# Patient Record
Sex: Female | Born: 1970 | Race: White | Hispanic: No | Marital: Married | Smoking: Never smoker
Health system: Southern US, Community
[De-identification: ages and names within clinical notes are randomized; demographics above are authoritative.]

---

## 2008-06-09 HISTORY — PX: PARATHYROIDECTOMY: SHX19

## 2014-02-24 LAB — HM MAMMOGRAPHY

## 2014-02-24 LAB — HM PAP SMEAR: HM Pap smear: NEGATIVE

## 2015-01-28 ENCOUNTER — Encounter (HOSPITAL_COMMUNITY): Payer: Self-pay | Admitting: *Deleted

## 2015-01-28 ENCOUNTER — Emergency Department (HOSPITAL_COMMUNITY): Payer: Managed Care, Other (non HMO)

## 2015-01-28 DIAGNOSIS — Z23 Encounter for immunization: Secondary | ICD-10-CM | POA: Diagnosis not present

## 2015-01-28 DIAGNOSIS — S20221A Contusion of right back wall of thorax, initial encounter: Secondary | ICD-10-CM | POA: Diagnosis not present

## 2015-01-28 DIAGNOSIS — Y9389 Activity, other specified: Secondary | ICD-10-CM | POA: Diagnosis not present

## 2015-01-28 DIAGNOSIS — Y9289 Other specified places as the place of occurrence of the external cause: Secondary | ICD-10-CM | POA: Diagnosis not present

## 2015-01-28 DIAGNOSIS — Y998 Other external cause status: Secondary | ICD-10-CM | POA: Insufficient documentation

## 2015-01-28 DIAGNOSIS — S0990XA Unspecified injury of head, initial encounter: Secondary | ICD-10-CM | POA: Insufficient documentation

## 2015-01-28 DIAGNOSIS — S300XXA Contusion of lower back and pelvis, initial encounter: Secondary | ICD-10-CM | POA: Diagnosis not present

## 2015-01-28 DIAGNOSIS — S0101XA Laceration without foreign body of scalp, initial encounter: Secondary | ICD-10-CM | POA: Diagnosis not present

## 2015-01-28 DIAGNOSIS — W109XXA Fall (on) (from) unspecified stairs and steps, initial encounter: Secondary | ICD-10-CM | POA: Insufficient documentation

## 2015-01-28 NOTE — ED Notes (Signed)
Spoke with Dr Mora Bellman, received order for CT head.

## 2015-01-28 NOTE — ED Notes (Signed)
Pt states that she was walking down wood stairs in her socks and slipped and fell down 2 steps and hit the back of her head on the stair. Laceration noted to back of head. Denies LOC or any neurological deficits. Does not take blood thinners. Bleeding is controlled.

## 2015-01-28 NOTE — ED Notes (Signed)
Also c/o right sided rib/scapula pain.

## 2015-01-29 ENCOUNTER — Emergency Department (HOSPITAL_COMMUNITY): Payer: Managed Care, Other (non HMO)

## 2015-01-29 ENCOUNTER — Emergency Department (HOSPITAL_COMMUNITY)
Admission: EM | Admit: 2015-01-29 | Discharge: 2015-01-29 | Disposition: A | Discharge status: Home / Self Care | Payer: Managed Care, Other (non HMO) | Attending: Emergency Medicine | Admitting: Emergency Medicine

## 2015-01-29 DIAGNOSIS — IMO0002 Reserved for concepts with insufficient information to code with codable children: Secondary | ICD-10-CM

## 2015-01-29 DIAGNOSIS — W19XXXA Unspecified fall, initial encounter: Secondary | ICD-10-CM

## 2015-01-29 MED ORDER — TRAMADOL HCL 50 MG PO TABS: 50.0000 mg | ORAL_TABLET | Freq: Two times a day (BID) | ORAL | Status: DC | PRN

## 2015-01-29 MED ORDER — HYDROCODONE-ACETAMINOPHEN 5-325 MG PO TABS
1.0000 | ORAL_TABLET | Freq: Once | ORAL | Status: AC
  Administered 2015-01-29: 1 via ORAL
  Filled 2015-01-29: qty 1

## 2015-01-29 MED ORDER — TETANUS-DIPHTH-ACELL PERTUSSIS 5-2.5-18.5 LF-MCG/0.5 IM SUSP
0.5000 mL | Freq: Once | INTRAMUSCULAR | Status: AC
  Administered 2015-01-29: 0.5 mL via INTRAMUSCULAR
  Filled 2015-01-29: qty 0.5

## 2015-01-29 MED ORDER — IBUPROFEN 800 MG PO TABS
800.0000 mg | ORAL_TABLET | Freq: Once | ORAL | Status: AC
  Administered 2015-01-29: 800 mg via ORAL
  Filled 2015-01-29: qty 1

## 2015-01-29 NOTE — ED Provider Notes (Signed)
CSN: [id number]     Arrival date & time 01/28/15  2237 History  This chart was scribed for Tomasita Crumble, MD by Leone Payor, ED Scribe. This patient was seen in room A03C/A03C and the patient's care was started 12:44 AM.    Chief Complaint  Patient presents with  . Fall  . Head Laceration   The history is provided by the patient and a friend. No language interpreter was used.     HPI Comments: Shelby Sutton is a 44 y.o. female who presents to the Emergency Department complaining of fall that occurred 3 hours ago. Patient reports having a mechanical fall; states she slipped down stairs while wearing socks. She reports striking back of head and right shoulder/scapula upon falling. She denies LOC. She complains of a laceration to the posterior head along with constant, unchanged pain to right shoulder, rib, and lower back. She states movement aggravates her pain. She denies anti-coagulant use. She is unsure of last tetanus.   History reviewed. No pertinent past medical history. History reviewed. No pertinent past surgical history. No family history on file. Social History  Substance Use Topics  . Smoking status: Never Smoker   . Smokeless tobacco: None  . Alcohol Use: No   OB History    No data available     Review of Systems  10 Systems reviewed and all are negative for acute change except as noted in the HPI.    Allergies  Codeine  Home Medications   Prior to Admission medications   Not on File   BP 115/68 mmHg  Pulse 83  Temp(Src) 98.3 F (36.8 C) (Oral)  Resp 14  SpO2 100%  LMP 01/21/2015 Physical Exam  Constitutional: She is oriented to person, place, and time. She appears well-developed and well-nourished. No distress.  HENT:  Head: Normocephalic. Head is with laceration.  Nose: Nose normal.  Mouth/Throat: Oropharynx is clear and moist. No oropharyngeal exudate.  1 cm laceration to the right posterior scalp.   Eyes: Conjunctivae and EOM are normal. Pupils are  equal, round, and reactive to light. No scleral icterus.  Neck: Normal range of motion. Neck supple. No JVD present. No tracheal deviation present. No thyromegaly present.  Cardiovascular: Normal rate, regular rhythm and normal heart sounds.  Exam reveals no gallop and no friction rub.   No murmur heard. Pulmonary/Chest: Effort normal and breath sounds normal. No respiratory distress. She has no wheezes. She exhibits no tenderness.  Abdominal: Soft. Bowel sounds are normal. She exhibits no distension and no mass. There is no tenderness. There is no rebound and no guarding.  Musculoskeletal: Normal range of motion. She exhibits tenderness. She exhibits no edema.  Bruising and tenderness to right posterior ribs and lumbar spine.   Lymphadenopathy:    She has no cervical adenopathy.  Neurological: She is alert and oriented to person, place, and time. No cranial nerve deficit. She exhibits normal muscle tone.  Skin: Skin is warm and dry. Bruising noted. No rash noted. No erythema. No pallor.  Nursing note and vitals reviewed.   ED Course  Procedures   DIAGNOSTIC STUDIES: Oxygen Saturation is 100% on RA, normal by my interpretation.    COORDINATION OF CARE: 12:50 AM Discussed treatment plan with pt at bedside and pt agreed to plan.   Labs Review Labs Reviewed - No data to display  Imaging Review Dg Chest 2 View  01/28/2015   CLINICAL DATA:  Larey Seat down 2 steps, striking head. Pain to the right posterior  back into the shoulder blades.  EXAM: CHEST  2 VIEW  COMPARISON:  None.  FINDINGS: The heart size and mediastinal contours are within normal limits. Both lungs are clear. The visualized skeletal structures are unremarkable.  IMPRESSION: No active cardiopulmonary disease.   Electronically Signed   By: Burman Nieves M.D.   On: 01/28/2015 23:57   Ct Head Wo Contrast  01/29/2015   CLINICAL DATA:  Slip and fall injury down 2 steps, striking back of head. Laceration to the back of head. No loss  of consciousness. No neurologic deficit.  EXAM: CT HEAD WITHOUT CONTRAST  TECHNIQUE: Contiguous axial images were obtained from the base of the skull through the vertex without intravenous contrast.  COMPARISON:  None.  FINDINGS: Ventricles and sulci appear symmetrical. No ventricular dilatation. No mass effect or midline shift. No abnormal extra-axial fluid collections. Gray-white matter junctions are distinct. Basal cisterns are not effaced. No evidence of acute intracranial hemorrhage. No depressed skull fractures. Visualized paranasal sinuses and mastoid air cells are not opacified.  IMPRESSION: No acute intracranial abnormalities.   Electronically Signed   By: Burman Nieves M.D.   On: 01/29/2015 00:14   I have personally reviewed and evaluated these images and lab results as part of my medical decision-making.   EKG Interpretation None      MDM   Final diagnoses:  None   Patient presents to the ED after a fall with pain on her L spine, R post ribs, and top of her head.  Xrays are negative for fractures or significant injury.She was given motrin and norco for pain control.  Tetanus shot was updated.  She wil be DC with tramadol to take as needed.  Wound care follow up in 5-7 days.    LACERATION REPAIR Performed by: Tomasita Crumble Authorized byTomasita Crumble Consent: Verbal consent obtained. Risks and benefits: risks, benefits and alternatives were discussed Consent given by: patient Patient identity confirmed: provided demographic data Prepped and Draped in normal sterile fashion Wound explored  Laceration Location: Posterior scalp  Laceration Length: 2cm  No Foreign Bodies seen or palpated  Irrigation method: syringe Amount of cleaning: standard  Skin closure: staples  Number of staples: 3   Patient tolerance: Patient tolerated the procedure well with no immediate complications.   I personally performed the services described in this documentation, which was scribed in  my presence. The recorded information has been reviewed and is accurate.   Tomasita Crumble, MD 01/29/15 [phone]

## 2015-01-29 NOTE — Discharge Instructions (Signed)
Laceration Care, Adult Shelby Sutton, your xrays and CT scan did not show any injuries.  Take motrin as needed for pain control.  If the pain becomes severe, take tramadol.  Tramadol will make you drowsy when you take it.  See a primary care physician within 5-7 days for wound check.  If symptoms worsen, come back to the ED immediately.  Thank you. A laceration is a cut that goes through all layers of the skin. The cut goes into the tissue beneath the skin. HOME CARE For stitches (sutures) or staples:  Keep the cut clean and dry.  If you have a bandage (dressing), change it at least once a day. Change the bandage if it gets wet or dirty, or as told by your doctor.  Wash the cut with soap and water 2 times a day. Rinse the cut with water. Pat it dry with a clean towel.  Put a thin layer of medicated cream on the cut as told by your doctor.  You may shower after the first 24 hours. Do not soak the cut in water until the stitches are removed.  Only take medicines as told by your doctor.  Have your stitches or staples removed as told by your doctor. For skin adhesive strips:  Keep the cut clean and dry.  Do not get the strips wet. You may take a bath, but be careful to keep the cut dry.  If the cut gets wet, pat it dry with a clean towel.  The strips will fall off on their own. Do not remove the strips that are still stuck to the cut. For wound glue:  You may shower or take baths. Do not soak or scrub the cut. Do not swim. Avoid heavy sweating until the glue falls off on its own. After a shower or bath, pat the cut dry with a clean towel.  Do not put medicine on your cut until the glue falls off.  If you have a bandage, do not put tape over the glue.  Avoid lots of sunlight or tanning lamps until the glue falls off. Put sunscreen on the cut for the first year to reduce your scar.  The glue will fall off on its own. Do not pick at the glue. You may need a tetanus shot if:  You cannot  remember when you had your last tetanus shot.  You have never had a tetanus shot. If you need a tetanus shot and you choose not to have one, you may get tetanus. Sickness from tetanus can be serious. GET HELP RIGHT AWAY IF:   Your pain does not get better with medicine.  Your arm, hand, leg, or foot loses feeling (numbness) or changes color.  Your cut is bleeding.  Your joint feels weak, or you cannot use your joint.  You have painful lumps on your body.  Your cut is red, puffy (swollen), or painful.  You have a red line on the skin near the cut.  You have yellowish-white fluid (pus) coming from the cut.  You have a fever.  You have a bad smell coming from the cut or bandage.  Your cut breaks open before or after stitches are removed.  You notice something coming out of the cut, such as wood or glass.  You cannot move a finger or toe. MAKE SURE YOU:   Understand these instructions.  Will watch your condition.  Will get help right away if you are not doing well or get worse.  Document Released: 11/12/2007 Document Revised: 08/18/2011 Document Reviewed: 11/19/2010 [facility] Patient Information 2015 [place], [place]. This information is not intended to replace advice given to you by your health care provider. Make sure you discuss any questions you have with your health care provider.  Contusion A contusion is a deep bruise. Contusions happen when an injury causes bleeding under the skin. Signs of bruising include pain, puffiness (swelling), and discolored skin. The contusion may turn blue, purple, or yellow. HOME CARE   Put ice on the injured area.  Put ice in a plastic bag.  Place a towel between your skin and the bag.  Leave the ice on for 15-20 minutes, 03-04 times a day.  Only take medicine as told by your doctor.  Rest the injured area.  If possible, raise (elevate) the injured area to lessen puffiness. GET HELP RIGHT AWAY IF:   You have more bruising or  puffiness.  You have pain that is getting worse.  Your puffiness or pain is not helped by medicine. MAKE SURE YOU:   Understand these instructions.  Will watch your condition.  Will get help right away if you are not doing well or get worse. Document Released: 11/12/2007 Document Revised: 08/18/2011 Document Reviewed: 03/31/2011 [facility] Patient Information 2015 [place], [place]. This information is not intended to replace advice given to you by your health care provider. Make sure you discuss any questions you have with your health care provider. Stitches, Staples, or Skin Adhesive Strips  Stitches (sutures), staples, and skin adhesive strips hold the skin together as it heals. They will usually be in place for 7 days or less. HOME CARE  Wash your hands with soap and water before and after you touch your wound.  Only take medicine as told by your doctor.  Cover your wound only if your doctor told you to. Otherwise, leave it open to air.  Do not get your stitches wet or dirty. If they get dirty, dab them gently with a clean washcloth. Wet the washcloth with soapy water. Do not rub. Pat them dry gently.  Do not put medicine or medicated cream on your stitches unless your doctor told you to.  Do not take out your own stitches or staples. Skin adhesive strips will fall off by themselves.  Do not pick at the wound. Picking can cause an infection.  Do not miss your follow-up appointment.  If you have problems or questions, call your doctor. GET HELP RIGHT AWAY IF:   You have a temperature by mouth above 102 F (38.9 C), not controlled by medicine.  You have chills.  You have redness or pain around your stitches.  There is puffiness (swelling) around your stitches.  You notice fluid (drainage) from your stitches.  There is a bad smell coming from your wound. MAKE SURE YOU:  Understand these instructions.  Will watch your condition.  Will get help if you are not doing  well or get worse. Document Released: 03/23/2009 Document Revised: 08/18/2011 Document Reviewed: 03/23/2009 [facility] Patient Information 2015 [place], [place]. This information is not intended to replace advice given to you by your health care provider. Make sure you discuss any questions you have with your health care provider.

## 2016-03-10 ENCOUNTER — Other Ambulatory Visit: Payer: Self-pay | Admitting: Orthopedic Surgery

## 2016-03-10 DIAGNOSIS — R29898 Other symptoms and signs involving the musculoskeletal system: Secondary | ICD-10-CM

## 2016-03-21 ENCOUNTER — Ambulatory Visit
Admission: RE | Admit: 2016-03-21 | Discharge: 2016-03-21 | Disposition: A | Discharge status: Home / Self Care | Payer: Managed Care, Other (non HMO) | Source: Ambulatory Visit | Attending: Orthopedic Surgery | Admitting: Orthopedic Surgery

## 2016-03-21 DIAGNOSIS — R29898 Other symptoms and signs involving the musculoskeletal system: Secondary | ICD-10-CM

## 2016-03-21 MED ORDER — IOPAMIDOL (ISOVUE-M 200) INJECTION 41%
1.0000 mL | Freq: Once | INTRAMUSCULAR | Status: AC
  Administered 2016-03-21: 1 mL via EPIDURAL

## 2016-03-21 MED ORDER — METHYLPREDNISOLONE ACETATE 40 MG/ML INJ SUSP (RADIOLOG
120.0000 mg | Freq: Once | INTRAMUSCULAR | Status: AC
  Administered 2016-03-21: 120 mg via EPIDURAL

## 2016-03-21 NOTE — Discharge Instructions (Signed)

## 2016-07-24 IMAGING — CT CT HEAD W/O CM
1 series · 16 of 30 positions shown, 20 images · non-contrast
Comparison: None.

CLINICAL DATA: Slip and fall injury down 2 steps, striking back of
head. Laceration to the back of head. No loss of consciousness. No
neurologic deficit.

EXAM:
CT HEAD WITHOUT CONTRAST
TECHNIQUE: Contiguous axial images were obtained from the base of the skull
through the vertex without intravenous contrast.

[Series 2: head 5.0 h30s · axial · 0.47mm/px · z∈[-127,+18]mm · 16 of 33 slices shown, 20 images]
[im 2/33  brain]
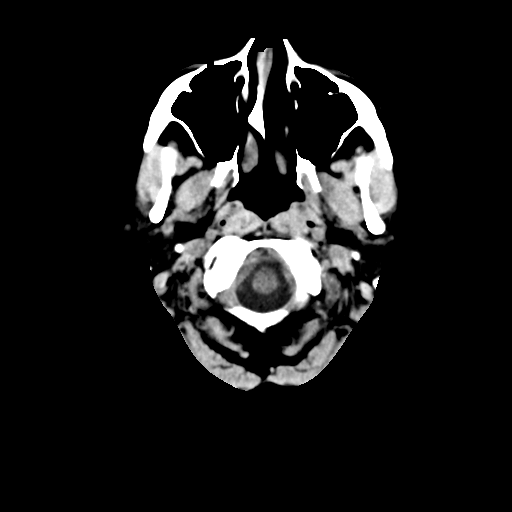
[im 2/33  bone]
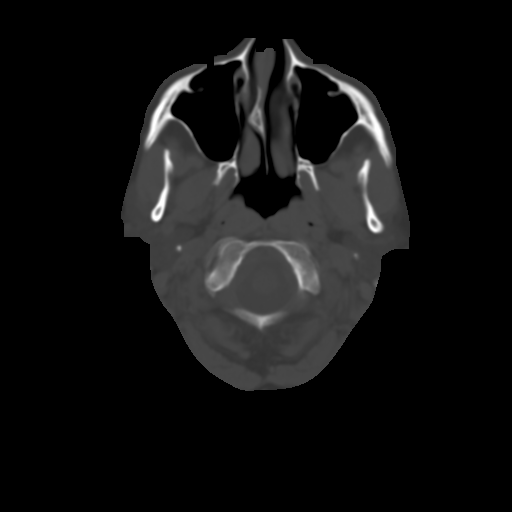
[im 4/33  brain]
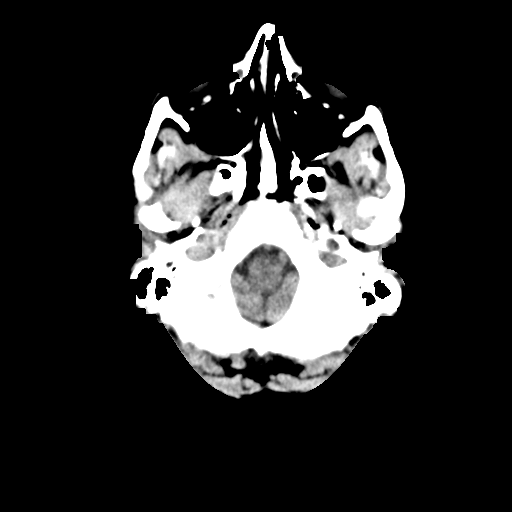
[im 6/33  brain]
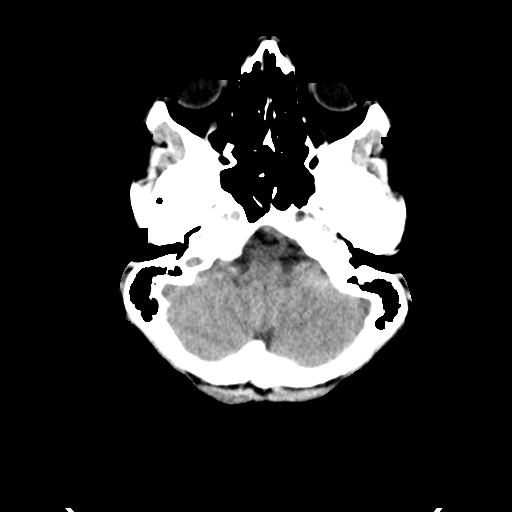
[im 8/33  brain]
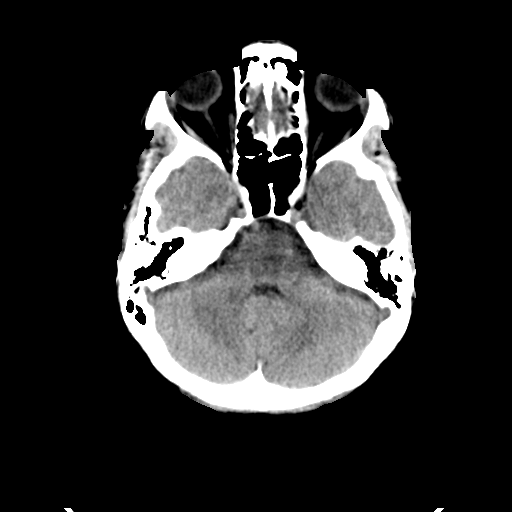
[im 9/33  brain]
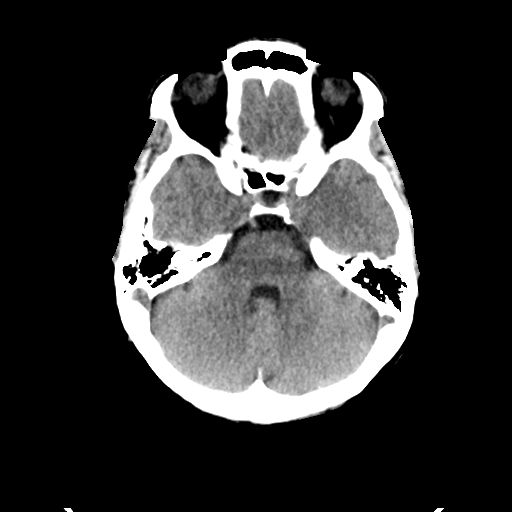
[im 9/33  bone]
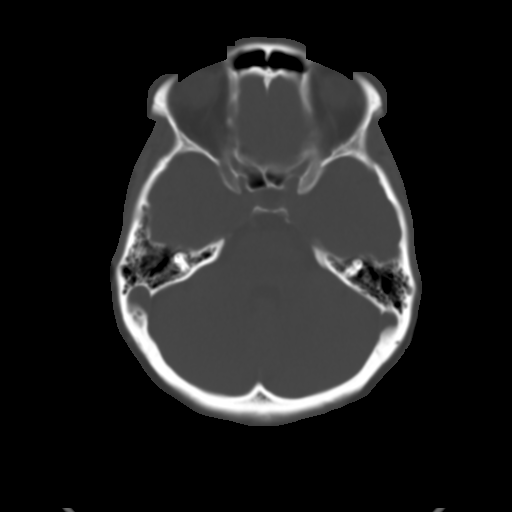
[im 12/33  brain]
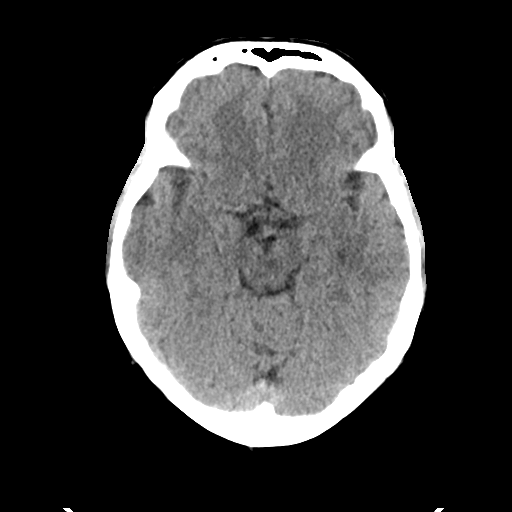
[im 14/33  brain]
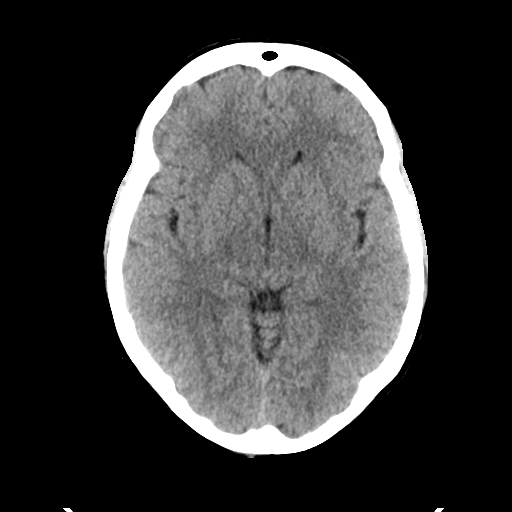
[im 16/33  brain]
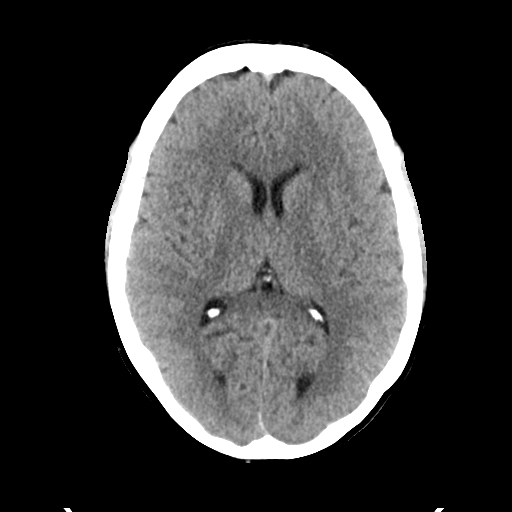
[im 17/33  brain]
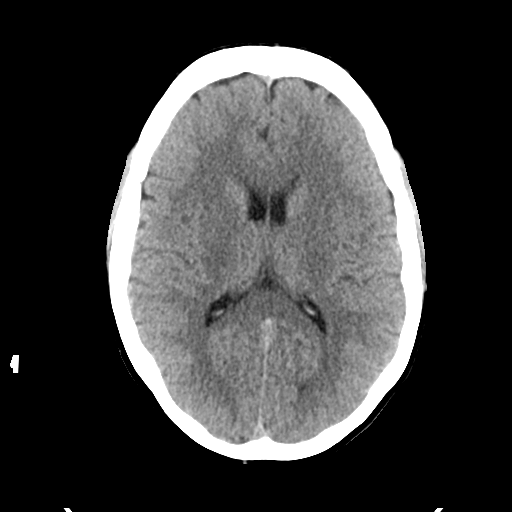
[im 17/33  bone]
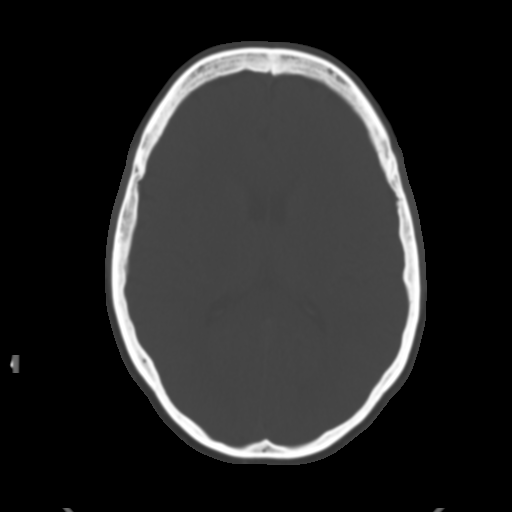
[im 19/33  brain]
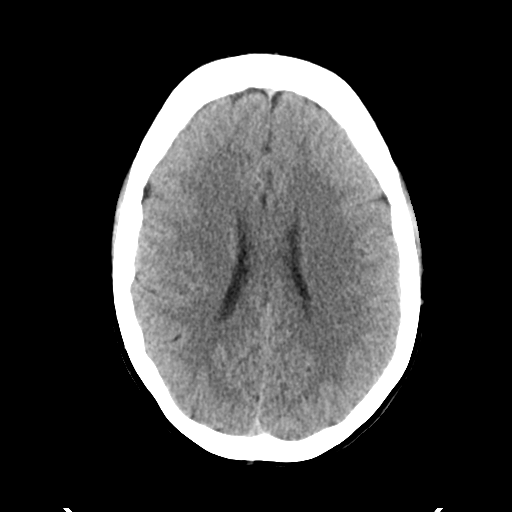
[im 21/33  brain]
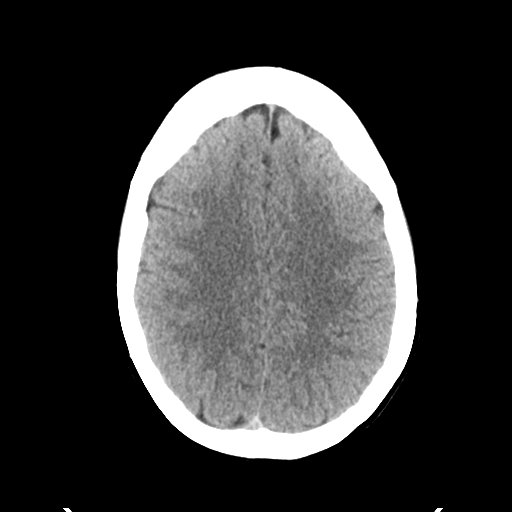
[im 24/33  brain]
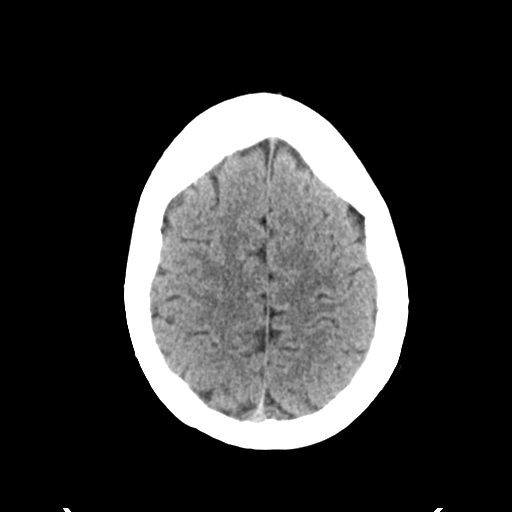
[im 25/33  brain]
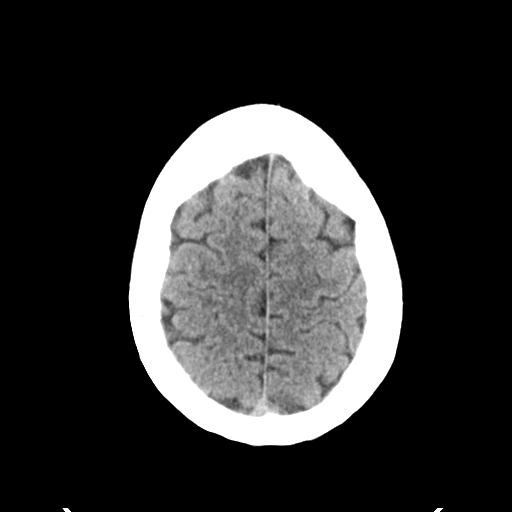
[im 25/33  bone]
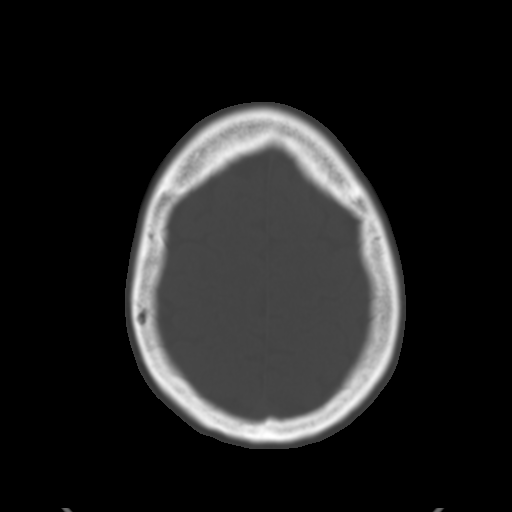
[im 27/33  brain]
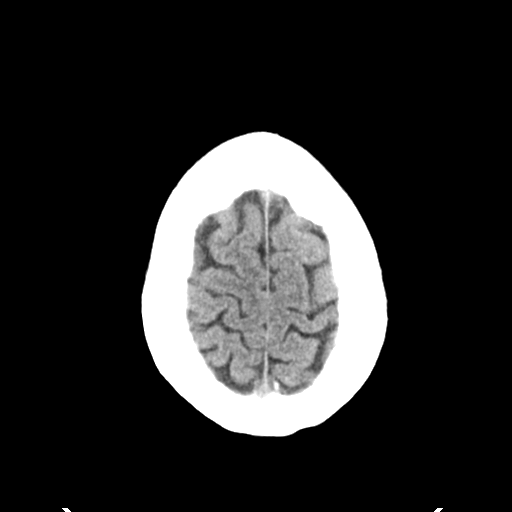
[im 29/33  brain]
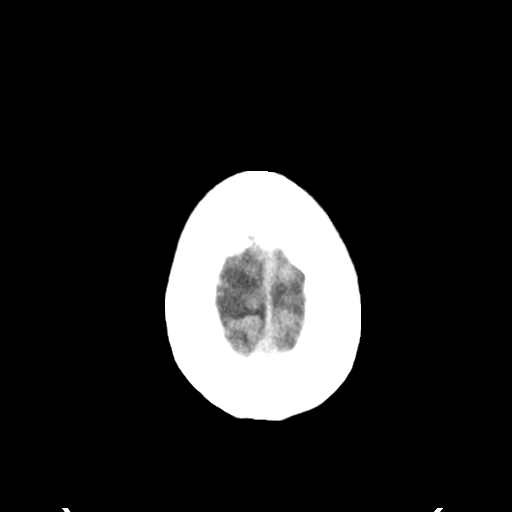
[im 31/33  brain]
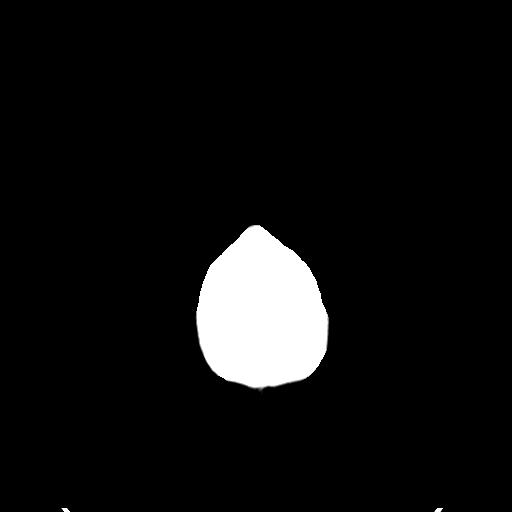

[16 of 30 positions shown; findings below may reference images not displayed]

FINDINGS: Ventricles and sulci appear symmetrical. No ventricular dilatation.
No mass effect or midline shift. No abnormal extra-axial fluid
collections. Gray-white matter junctions are distinct. Basal
cisterns are not effaced. No evidence of acute intracranial
hemorrhage. No depressed skull fractures. Visualized paranasal
sinuses and mastoid air cells are not opacified.
IMPRESSION: No acute intracranial abnormalities.

## 2016-11-10 ENCOUNTER — Ambulatory Visit (INDEPENDENT_AMBULATORY_CARE_PROVIDER_SITE_OTHER): Payer: Managed Care, Other (non HMO) | Admitting: Family Medicine

## 2016-11-10 ENCOUNTER — Telehealth: Payer: Self-pay | Admitting: Family Medicine

## 2016-11-10 ENCOUNTER — Encounter: Payer: Self-pay | Admitting: Family Medicine

## 2016-11-10 VITALS — BP 122/80 | HR 85 | Temp 98.1°F | Ht 66.5 in | Wt 156.4 lb

## 2016-11-10 DIAGNOSIS — Z77018 Contact with and (suspected) exposure to other hazardous metals: Secondary | ICD-10-CM | POA: Diagnosis not present

## 2016-11-10 DIAGNOSIS — R41840 Attention and concentration deficit: Secondary | ICD-10-CM

## 2016-11-10 DIAGNOSIS — Z9889 Other specified postprocedural states: Secondary | ICD-10-CM

## 2016-11-10 DIAGNOSIS — R5383 Other fatigue: Secondary | ICD-10-CM | POA: Diagnosis not present

## 2016-11-10 DIAGNOSIS — E892 Postprocedural hypoparathyroidism: Secondary | ICD-10-CM

## 2016-11-10 LAB — COMPREHENSIVE METABOLIC PANEL
ALK PHOS: 86 U/L (ref 39–117)
ALT: 17 U/L (ref 0–35)
AST: 20 U/L (ref 0–37)
Albumin: 4.6 g/dL (ref 3.5–5.2)
BUN: 15 mg/dL (ref 6–23)
CALCIUM: 9.5 mg/dL (ref 8.4–10.5)
CO2: 29 meq/L (ref 19–32)
CREATININE: 0.58 mg/dL (ref 0.40–1.20)
Chloride: 103 mEq/L (ref 96–112)
GFR: 118.19 mL/min (ref >60.00)
GLUCOSE: 95 mg/dL (ref 70–99)
Potassium: 4.2 mEq/L (ref 3.5–5.1)
Sodium: 137 mEq/L (ref 135–145)
TOTAL PROTEIN: 7.4 g/dL (ref 6.0–8.3)
Total Bilirubin: 0.3 mg/dL (ref 0.2–1.2)

## 2016-11-10 LAB — CBC
HCT: 39.1 % (ref 36.0–46.0)
HEMOGLOBIN: 13.2 g/dL (ref 12.0–15.0)
MCHC: 33.7 g/dL (ref 30.0–36.0)
MCV: 88.5 fl (ref 78.0–100.0)
PLATELETS: 284 10*3/uL (ref 150.0–400.0)
RBC: 4.42 Mil/uL (ref 3.87–5.11)
RDW: 13.2 % (ref 11.5–15.5)
WBC: 5.6 10*3/uL (ref 4.0–10.5)

## 2016-11-10 LAB — VITAMIN B12: Vitamin B-12: 363 pg/mL (ref 211–911)

## 2016-11-10 LAB — VITAMIN D 25 HYDROXY (VIT D DEFICIENCY, FRACTURES): VITD: 21.41 ng/mL — AB (ref 30.00–100.00)

## 2016-11-10 NOTE — Telephone Encounter (Signed)
[facility]

## 2016-11-10 NOTE — Patient Instructions (Signed)
It was a pleasure to meet you today! I look forward to partnering with you for your health care needs  Please schedule your complete physical exam at your convenience  Increase exercise, try to find some time for activities you enjoy  If you would like referral for ADD testing, please let me know

## 2016-11-10 NOTE — Progress Notes (Signed)
Subjective:    Patient ID: Shelby Sutton, female    DOB: [birth year], 46 y.o.   MRN: [id number]  HPI This is a 46 yo female who presents today to establish care. She is married with two children. She is not currently working. Moved to [place] from [place] 2 years ago. Has a lot of stress, her 61 yo mother lives with her and her family. Mother's health is poor. Her father is deceased and she took care of him while he died. Her main complaint today is low energy. Has always been low energy her entire life. Thinks she may have ADD, has difficulty with focus and concentration. Has been on Lexapro and Wellbutrin in the past. Wellbutrin gave her side effects- blunted emotions. She sleeps well but does not feel rested. Has gained 20 pounds in last 2 years, admits to comfort eating. Attends a [employer] Study, but has not made many friends since moving.   Is concerned for Mercury poisoning, has old dental fillings. Is looking into having them replaced with non-mercury fillings.She feels her symptoms could be related to information she has found on line about mercury poisoning.    Periods a little irregular, still having monthly, sometimes heavier, sometimes lighter. No vasomotor symptoms. Some increased irritation and moodiness.   No past medical history on file. Past Surgical History:  Procedure Laterality Date  . PARATHYROIDECTOMY  2010   Family History  Problem Relation Age of Onset  . Adopted: Yes   Social History  Substance Use Topics  . Smoking status: Never Smoker  . Smokeless tobacco: Never Used  . Alcohol use No       Review of Systems  Constitutional: Positive for fatigue.  HENT: Positive for tinnitus (longstanding).   Neurological: Negative for headaches.  Psychiatric/Behavioral: Positive for decreased concentration and dysphoric mood.       Objective:   Physical Exam Physical Exam  Constitutional: Oriented to person, place, and time. He appears well-developed and  well-nourished.  HENT:  Head: Normocephalic and atraumatic.  Eyes: Conjunctivae are normal.  Neck: Normal range of motion. Neck supple.  Cardiovascular: Normal rate, regular rhythm and normal heart sounds.   Pulmonary/Chest: Effort normal and breath sounds normal.  Musculoskeletal: Normal range of motion.  Neurological: Alert and oriented to person, place, and time.  Skin: Skin is warm and dry.  Psychiatric: Intermittently tearful throughout encounter. Judgment and thought content normal.  Vitals reviewed.  BP 122/80 (BP Location: Left Arm, Patient Position: Sitting, Cuff Size: Normal)   Pulse 85   Temp 98.1 F (36.7 C) (Oral)   Ht 5' 6.5" (1.689 m)   Wt 156 lb 6.4 oz (70.9 kg)   LMP 10/26/2016   SpO2 99%   BMI 24.87 kg/m  Depression screen [facility] 2/9 11/10/2016 11/10/2016  Decreased Interest 0 0  Down, Depressed, Hopeless 0 -  PHQ - 2 Score 0 0      Assessment & Plan:  1. Fatigue, unspecified type - longstanding, encouraged regular exercise, increased socialization, good food choices - CBC - Comprehensive metabolic panel - Thyroid Panel With TSH - Vitamin B12 - VITAMIN D 25 Hydroxy (Vit-D Deficiency, Fractures)  2. Exposure to mercury - Heavy Metals Panel, Blood  3. Poor concentration - offered referral for formalized testing, discussed possible treatments, correlation with mood - she declines testing at this time  - follow up CPE with pap in 2-4 months   Olean Ree, FNP-BC  [facility] [place], [facility]  11/11/2016 6:20 AM

## 2016-11-11 DIAGNOSIS — E892 Postprocedural hypoparathyroidism: Secondary | ICD-10-CM | POA: Insufficient documentation

## 2016-11-11 DIAGNOSIS — Z9889 Other specified postprocedural states: Secondary | ICD-10-CM | POA: Insufficient documentation

## 2016-11-11 LAB — THYROID PANEL WITH TSH
FREE THYROXINE INDEX: 2.1 (ref 1.4–3.8)
T3 UPTAKE: 27 % (ref 22–35)
T4, Total: 7.6 ug/dL (ref 4.5–12.0)
TSH: 0.52 m[IU]/L

## 2016-11-14 LAB — HEAVY METALS PANEL, BLOOD
Arsenic: 3 mcg/L (ref <23)
Mercury, B: <4 mcg/L (ref <=10)

## 2016-11-17 NOTE — Telephone Encounter (Signed)
Rec'd from [facility] forwarded 65 pages to Olean ReeGessner Deborah FNP

## 2016-12-22 ENCOUNTER — Encounter: Payer: Self-pay | Admitting: Family Medicine

## 2017-05-06 LAB — HM PAP SMEAR: HM Pap smear: NEGATIVE

## 2017-05-13 ENCOUNTER — Ambulatory Visit: Payer: Managed Care, Other (non HMO) | Admitting: Family Medicine

## 2017-05-15 ENCOUNTER — Encounter: Payer: Self-pay | Admitting: Family Medicine

## 2017-05-15 ENCOUNTER — Ambulatory Visit: Payer: Managed Care, Other (non HMO) | Admitting: Family Medicine

## 2017-05-15 VITALS — BP 120/80 | HR 85 | Temp 98.1°F | Wt 156.8 lb

## 2017-05-15 DIAGNOSIS — R5383 Other fatigue: Secondary | ICD-10-CM

## 2017-05-15 DIAGNOSIS — Z9889 Other specified postprocedural states: Secondary | ICD-10-CM

## 2017-05-15 DIAGNOSIS — E559 Vitamin D deficiency, unspecified: Secondary | ICD-10-CM

## 2017-05-15 DIAGNOSIS — M5431 Sciatica, right side: Secondary | ICD-10-CM

## 2017-05-15 DIAGNOSIS — E892 Postprocedural hypoparathyroidism: Secondary | ICD-10-CM

## 2017-05-15 MED ORDER — MELOXICAM 15 MG PO TABS: 15.0000 mg | ORAL_TABLET | Freq: Every day | ORAL | 1 refills | Status: DC

## 2017-05-15 MED ORDER — GABAPENTIN 100 MG PO CAPS: ORAL_CAPSULE | ORAL | 3 refills | Status: DC

## 2017-05-15 NOTE — Progress Notes (Addendum)
Shelby Sutton is a 46 y.o. female is here for follow up.  History of Present Illness:   HPI: See Assessment and Plan section for Problem Based Charting of issues discussed today.  Health Maintenance Due  Topic Date Due  . HIV Screening  05/14/1984  . PAP SMEAR  02/24/2017   Depression screen PHQ 2/9 11/10/2016 11/10/2016  Decreased Interest 0 0  Down, Depressed, Hopeless 0 -  PHQ - 2 Score 0 0   PMHx, SurgHx, SocialHx, FamHx, Medications, and Allergies were reviewed in the Visit Navigator and updated as appropriate.   Patient Active Problem List   Diagnosis Date Noted  . Right sided sciatica 05/20/2017  . Vitamin D deficiency 05/20/2017  . Fatigue 05/20/2017  . Status post parathyroidectomy (HCC) 11/11/2016   Social History   Tobacco Use  . Smoking status: Never Smoker  . Smokeless tobacco: Never Used  Substance Use Topics  . Alcohol use: No  . Drug use: No   Current Medications and Allergies:   .  gabapentin (NEURONTIN) 100 MG capsule, Please take 1-2 tablets at night., Disp: 60 capsule, Rfl: 3 .  meloxicam (MOBIC) 15 MG tablet, Take 1 tablet (15 mg total) by mouth daily., Disp: 30 tablet, Rfl: 1  Allergies  Allergen Reactions  . Codeine Other (See Comments)    agitated   Review of Systems   Pertinent items are noted in the HPI. Otherwise, ROS is negative.  Vitals:   Vitals:   05/15/17 0944  BP: 120/80  Pulse: 85  Temp: 98.1 F (36.7 C)  TempSrc: Oral  SpO2: 98%  Weight: 156 lb 12.8 oz (71.1 kg)     Body mass index is 24.93 kg/m.   Physical Exam:   Physical Exam  Constitutional: She appears well-nourished.  HENT:  Head: Normocephalic and atraumatic.  Eyes: EOM are normal. Pupils are equal, round, and reactive to light.  Neck: Normal range of motion. Neck supple.  Cardiovascular: Normal rate, regular rhythm, normal heart sounds and intact distal pulses.  Pulmonary/Chest: Effort normal.  Abdominal: Soft.  Skin: Skin is warm.  Psychiatric: She  has a normal mood and affect. Her behavior is normal.  Nursing note and vitals reviewed.   Assessment and Plan:   Shelby FiscalLori was seen today for pain and fatigue.  Diagnoses and all orders for this visit:  Right sided sciatica Comments: Hx of chronic right sciatica, s/p EDI and oral medications. She saw a chiropractor for a year with some relief. [facility]. She states that the pain is a constant ache that keeps her from being able to sleep through the night. Will trial pain regimen below. Regular exercise and healthy eating recommended.  Orders: -     gabapentin (NEURONTIN) 100 MG capsule; Please take 1-2 tablets at night. -     meloxicam (MOBIC) 15 MG tablet; Take 1 tablet (15 mg total) by mouth daily.  Status post parathyroidectomy [facility](HCC) Comments: Will request records to view recent labs.   Fatigue, unspecified type Comments: As above. + snoring. Will monitor and consider sleep study in the future.   Vitamin D deficiency Comments: Continue supplementation.    Records requested if needed. Time spent with the patient: 30 minutes, of which >50% was spent in obtaining information about her symptoms, reviewing her previous labs, evaluations, and treatments, counseling her about her condition (please see the discussed topics above), and developing a plan to further investigate it; she had a number of questions which I addressed.   .Marland Kitchen  Reviewed expectations re: course of current medical issues. . Discussed self-management of symptoms. . Outlined signs and symptoms indicating need for more acute intervention. . Patient verbalized understanding and all questions were answered. Marland Kitchen. Health Maintenance issues including appropriate healthy diet, exercise, and smoking avoidance were discussed with patient. . See orders for this visit as documented in the electronic medical record. . Patient received an After Visit Summary.  Helane RimaErica Arliss Hepburn, DO [place], [place] 05/20/2017  No future appointments.

## 2017-05-19 ENCOUNTER — Encounter: Payer: Self-pay | Admitting: Family Medicine

## 2017-05-20 DIAGNOSIS — E559 Vitamin D deficiency, unspecified: Secondary | ICD-10-CM | POA: Insufficient documentation

## 2017-05-20 DIAGNOSIS — R5383 Other fatigue: Secondary | ICD-10-CM | POA: Insufficient documentation

## 2017-05-20 DIAGNOSIS — M5431 Sciatica, right side: Secondary | ICD-10-CM | POA: Insufficient documentation

## 2017-05-23 LAB — TSH: TSH: 0.66 (ref 0.41–5.90)

## 2017-05-23 LAB — LIPID PANEL
Cholesterol: 203 — AB (ref 0–200)
HDL: 69 (ref 35–70)
LDL CALC: 117
Triglycerides: 80 (ref 40–160)

## 2017-05-23 LAB — HEMOGLOBIN A1C: HEMOGLOBIN A1C: 5.4

## 2017-05-23 LAB — VITAMIN D 25 HYDROXY (VIT D DEFICIENCY, FRACTURES): VIT D 25 HYDROXY: 20

## 2017-06-05 LAB — HM MAMMOGRAPHY: HM Mammogram: ABNORMAL — AB (ref 0–4)

## 2017-06-10 ENCOUNTER — Other Ambulatory Visit: Payer: Self-pay | Admitting: Obstetrics and Gynecology

## 2017-06-10 DIAGNOSIS — R928 Other abnormal and inconclusive findings on diagnostic imaging of breast: Secondary | ICD-10-CM

## 2017-06-12 ENCOUNTER — Ambulatory Visit
Admission: RE | Admit: 2017-06-12 | Discharge: 2017-06-12 | Disposition: A | Discharge status: Home / Self Care | Payer: Managed Care, Other (non HMO) | Source: Ambulatory Visit | Attending: Obstetrics and Gynecology | Admitting: Obstetrics and Gynecology

## 2017-06-12 DIAGNOSIS — R928 Other abnormal and inconclusive findings on diagnostic imaging of breast: Secondary | ICD-10-CM

## 2017-06-24 ENCOUNTER — Encounter: Payer: Self-pay | Admitting: Physical Therapy

## 2018-02-22 ENCOUNTER — Ambulatory Visit: Payer: Managed Care, Other (non HMO) | Admitting: Family Medicine

## 2018-02-22 ENCOUNTER — Encounter: Payer: Self-pay | Admitting: Family Medicine

## 2018-02-22 VITALS — BP 116/84 | HR 77 | Temp 98.5°F | Ht 66.5 in | Wt 160.2 lb

## 2018-02-22 DIAGNOSIS — E559 Vitamin D deficiency, unspecified: Secondary | ICD-10-CM

## 2018-02-22 DIAGNOSIS — E892 Postprocedural hypoparathyroidism: Secondary | ICD-10-CM

## 2018-02-22 DIAGNOSIS — M2550 Pain in unspecified joint: Secondary | ICD-10-CM | POA: Diagnosis not present

## 2018-02-22 DIAGNOSIS — Z9889 Other specified postprocedural states: Secondary | ICD-10-CM

## 2018-02-22 LAB — COMPREHENSIVE METABOLIC PANEL
ALBUMIN: 4.4 g/dL (ref 3.5–5.2)
ALK PHOS: 85 U/L (ref 39–117)
ALT: 25 U/L (ref 0–35)
AST: 23 U/L (ref 0–37)
BILIRUBIN TOTAL: 0.3 mg/dL (ref 0.2–1.2)
BUN: 18 mg/dL (ref 6–23)
CO2: 29 mEq/L (ref 19–32)
Calcium: 9.3 mg/dL (ref 8.4–10.5)
Chloride: 103 mEq/L (ref 96–112)
Creatinine, Ser: 0.55 mg/dL (ref 0.40–1.20)
GFR: 124.98 mL/min (ref >60.00)
Glucose, Bld: 88 mg/dL (ref 70–99)
POTASSIUM: 3.9 meq/L (ref 3.5–5.1)
SODIUM: 137 meq/L (ref 135–145)
TOTAL PROTEIN: 7.2 g/dL (ref 6.0–8.3)

## 2018-02-22 LAB — CBC WITH DIFFERENTIAL/PLATELET
Basophils Absolute: 0 10*3/uL (ref 0.0–0.1)
Basophils Relative: 0.7 % (ref 0.0–3.0)
EOS ABS: 0.1 10*3/uL (ref 0.0–0.7)
Eosinophils Relative: 1.3 % (ref 0.0–5.0)
HCT: 37.6 % (ref 36.0–46.0)
Hemoglobin: 12.6 g/dL (ref 12.0–15.0)
LYMPHS ABS: 1.3 10*3/uL (ref 0.7–4.0)
Lymphocytes Relative: 17.4 % (ref 12.0–46.0)
MCHC: 33.6 g/dL (ref 30.0–36.0)
MCV: 87.9 fl (ref 78.0–100.0)
MONO ABS: 0.5 10*3/uL (ref 0.1–1.0)
MONOS PCT: 6.3 % (ref 3.0–12.0)
NEUTROS ABS: 5.5 10*3/uL (ref 1.4–7.7)
NEUTROS PCT: 74.3 % (ref 43.0–77.0)
PLATELETS: 280 10*3/uL (ref 150.0–400.0)
RBC: 4.28 Mil/uL (ref 3.87–5.11)
RDW: 12.7 % (ref 11.5–15.5)
WBC: 7.4 10*3/uL (ref 4.0–10.5)

## 2018-02-22 LAB — SEDIMENTATION RATE: SED RATE: 14 mm/h (ref 0–20)

## 2018-02-22 LAB — VITAMIN D 25 HYDROXY (VIT D DEFICIENCY, FRACTURES): VITD: 32.31 ng/mL (ref 30.00–100.00)

## 2018-02-22 LAB — TSH: TSH: 0.47 u[IU]/mL (ref 0.35–4.50)

## 2018-02-22 LAB — C-REACTIVE PROTEIN: CRP: 0.2 mg/dL — ABNORMAL LOW (ref 0.5–20.0)

## 2018-02-22 NOTE — Progress Notes (Signed)
Patient: Shelby Sutton MRN: [id number] DOB: 04/22/1971 PCP: Helane RimaWallace, Erica, DO     Subjective:  Chief Complaint  Patient presents with  . Joint Pain    knees,hips, shoulder    HPI: The patient is a 47 y.o. female who presents today for joint pain in her knees/hips/shoulders/elbows for about 6 months-1 year. She describes it as stiff and aching. Worse after sitting for long periods of time. It is keeping her up at night due to the pain. Pain is constant. She has a right sided disc herniation that has caused right sided leg numbness and some pain, but she is having pain in so many other joints that she doesn't feel like this is the cause. Pain rated as 3-4/10 and described as achy. Aleve and advil do help the pain. Does not completely alleviate the pain, but does help. She takes it at night. Pain is just there. She denies any joint swelling/redness.  No problems with her hands. NO hx of RA/lupus in family. No trauma to any of her joints and no decreased ROm.   She also had a stress fracture recently and has hx of parathyroidecetomy with no follow up DEXAs done. Denies any other pathological fractures. No hx of steroid use and does not smoke.   Hx of vitamin D deficiency and I wanting to know if we can check this today. Not good at taking her replacements.   Review of Systems  Constitutional: Negative for chills, fatigue, fever and unexpected weight change.  HENT: Negative for mouth sores and nosebleeds.   Eyes:       Denies dry eyes/mouth   Respiratory: Negative for cough, shortness of breath and wheezing.   Cardiovascular: Negative for chest pain and palpitations.  Gastrointestinal: Negative for abdominal pain, diarrhea, nausea and vomiting.  Endocrine: Negative for cold intolerance and heat intolerance.  Musculoskeletal: Positive for arthralgias, back pain and myalgias. Negative for joint swelling.       Pt with c/o multiple areas of joint pain-knees, elbows, hips and shoulders  Skin:  Negative for rash.  Neurological: Negative for dizziness and headaches.  Psychiatric/Behavioral: Positive for sleep disturbance.    Allergies Patient is allergic to codeine.  Past Medical History Patient  has no past medical history on file.  Surgical History Patient  has a past surgical history that includes Parathyroidectomy (2010).  Family History Pateint's family history is not on file. She was adopted.  Social History Patient  reports that she has never smoked. She has never used smokeless tobacco. She reports that she does not drink alcohol or use drugs.    Objective: Vitals:   02/22/18 1327  BP: 116/84  Pulse: 77  Temp: 98.5 F (36.9 C)  TempSrc: Oral  SpO2: 97%  Weight: 160 lb 3.2 oz (72.7 kg)  Height: 5' 6.5" (1.689 m)    Body mass index is 25.47 kg/m.  Physical Exam  Constitutional: She is oriented to person, place, and time. She appears well-developed and well-nourished.  Eyes: Pupils are equal, round, and reactive to light. Conjunctivae and EOM are normal.  Neck: Normal range of motion. Neck supple. No thyromegaly present.  Cardiovascular: Normal rate, regular rhythm, normal heart sounds and intact distal pulses.  Pulmonary/Chest: Effort normal and breath sounds normal.  Abdominal: Soft. Bowel sounds are normal.  Musculoskeletal: Normal range of motion. She exhibits tenderness (TTP over her elbows, knees, hips and shoulders). She exhibits no edema.  Full rom in all of her joints. No edema/erythema or deformation.  Lymphadenopathy:    She has no cervical adenopathy.  Neurological: She is alert and oriented to person, place, and time. She displays normal reflexes. No cranial nerve deficit.  Vitals reviewed.      Assessment/plan: 1. Arthralgia, unspecified joint Discussed with her will check labs to rule out RA/auto immune issue, but history and exam not really supportive of this. Discussed she sounds more like fibromyalgia. Will f/u with lab work.  Advised tumeric for inflammation/arthritis. If all labs negative will need to f/u with PCP for possible treatment for fibromyalgia vs. OA.  - CBC with Differential/Platelet - Comprehensive metabolic panel - TSH - ANA - Rheumatoid factor - C-reactive protein - Sedimentation rate  2. Vitamin D deficiency  - VITAMIN D 25 Hydroxy (Vit-D Deficiency, Fractures)  3. Status post parathyroidectomy (HCC) Never had DEXA following her long hx of untreated elevated calcium/hyperparathyroid. Needs this done. Will order DEXA today. checking vitamin D. Discussed will send results to pcp.  - DG Bone Density; Future      Return if symptoms worsen or fail to improve.    Orland Mustard, MD [facility]   02/22/2018

## 2018-02-24 LAB — ANA: Anti Nuclear Antibody(ANA): NEGATIVE

## 2018-02-24 LAB — RHEUMATOID FACTOR: Rhuematoid fact SerPl-aCnc: <14 IU/mL (ref <14)

## 2018-02-25 ENCOUNTER — Telehealth: Payer: Self-pay

## 2018-02-25 NOTE — Telephone Encounter (Signed)
Copied from CRM [id number]. Topic: Referral - Question >> Feb 25, 2018  3:50 PM Baldo DaubAlexander, Amber L wrote: Reason for CRM:  Pt states that Dr. Artis FlockWolfe was supposed to order a bone density scan for her, but she hasn't heard anything.  Pt is calling to check on the status of this. Pt can be reached at [phone]

## 2018-02-25 NOTE — Telephone Encounter (Signed)
Called and spoke to patient.  Advised can take up to 2 wks to receive call to schedule appt for bone density scan.  Pt verbalized understanding.

## 2018-03-02 ENCOUNTER — Ambulatory Visit (INDEPENDENT_AMBULATORY_CARE_PROVIDER_SITE_OTHER)
Admission: RE | Admit: 2018-03-02 | Discharge: 2018-03-02 | Disposition: A | Discharge status: Home / Self Care | Payer: Managed Care, Other (non HMO) | Source: Ambulatory Visit | Attending: Family Medicine | Admitting: Family Medicine

## 2018-03-02 DIAGNOSIS — E559 Vitamin D deficiency, unspecified: Secondary | ICD-10-CM | POA: Diagnosis not present

## 2018-03-02 DIAGNOSIS — Z9889 Other specified postprocedural states: Secondary | ICD-10-CM

## 2018-03-02 DIAGNOSIS — E892 Postprocedural hypoparathyroidism: Secondary | ICD-10-CM | POA: Diagnosis not present

## 2018-03-04 ENCOUNTER — Ambulatory Visit: Payer: Managed Care, Other (non HMO) | Admitting: Family Medicine

## 2018-03-04 ENCOUNTER — Telehealth: Payer: Self-pay | Admitting: Family Medicine

## 2018-03-04 ENCOUNTER — Encounter: Payer: Self-pay | Admitting: Family Medicine

## 2018-03-04 VITALS — BP 110/70 | HR 73 | Temp 98.3°F | Ht 66.5 in | Wt 161.6 lb

## 2018-03-04 DIAGNOSIS — T148XXA Other injury of unspecified body region, initial encounter: Secondary | ICD-10-CM | POA: Diagnosis not present

## 2018-03-04 MED ORDER — CYCLOBENZAPRINE HCL 10 MG PO TABS: 10.0000 mg | ORAL_TABLET | Freq: Three times a day (TID) | ORAL | 0 refills | Status: AC | PRN

## 2018-03-04 MED ORDER — METHYLPREDNISOLONE ACETATE 80 MG/ML IJ SUSP
80.0000 mg | Freq: Once | INTRAMUSCULAR | Status: AC
  Administered 2018-03-04: 80 mg via INTRAMUSCULAR

## 2018-03-04 NOTE — Telephone Encounter (Signed)
Copied from CRM [id number]. Topic: General - Other >> Mar 04, 2018  8:32 AM Gaynelle Adu wrote: Reason for CRM:  Patient is calling to state she is having lower back spasms this is the second day, she stated she has a history of back issues. She is  wanting to either schdeule a appt for this afternoon after 12, or if she can have something be called into the pharmacy for her back.   Preferred Pharmacy:[facility] [phone] [place], [place] - [address] [phone] (Phone) [phone] (Fax)

## 2018-03-04 NOTE — Progress Notes (Signed)
Patient: Shelby Sutton MRN: [id number] DOB: [birth year] PCP: Helane Rima, DO     Subjective:  Chief Complaint  Patient presents with  . Back Pain    x 4-5 days    HPI: The patient is a 47 y.o. female who presents today for lower back pain.  Pain is worse on left side.  With movement pain is an 8/10. She has history of sciatica on right side and numbness. This is at her baseline. She states about 5 days ago she was cleaning and her left lower back started to hurt. She did not lift anything heavy. She tried to roll it out. On Monday it was achy and by Tuesday afternoon it had spasmed up. She can hardly walk. She has been icing and laying in chair/bed. Nothing is really helping it. She did sleep on a heating pad and this did help some. She felt better this AM, but after she started to walk around it started to tighten up again. Pain is on the left hip/glut and lower back. Pain rated as a 8/10. No radiation. Pain is cramping/spasm and only when moving. She has taken ibuprofen with little relief. No numbness, weakness or radicular symptoms.   Review of Systems  Constitutional: Negative for chills, fatigue and fever.  Respiratory: Negative for shortness of breath.   Cardiovascular: Negative for chest pain.  Musculoskeletal: Positive for back pain. Negative for arthralgias and neck pain.  Neurological: Positive for numbness (baseline on right side ). Negative for weakness.    Allergies Patient is allergic to codeine.  Past Medical History Patient  has no past medical history on file.  Surgical History Patient  has a past surgical history that includes Parathyroidectomy (2010).  Family History Pateint's family history is not on file. She was adopted.  Social History Patient  reports that she has never smoked. She has never used smokeless tobacco. She reports that she does not drink alcohol or use drugs.    Objective: Vitals:   03/04/18 1358  BP: 110/70  Pulse: 73  Temp: 98.3 F  (36.8 C)  TempSrc: Oral  SpO2: 98%  Weight: 161 lb 9.6 oz (73.3 kg)  Height: 5' 6.5" (1.689 m)    Body mass index is 25.69 kg/m.  Physical Exam  Constitutional: She is oriented to person, place, and time. She appears well-developed and well-nourished.  Musculoskeletal: She exhibits no edema.  Straight leg test negative on left. TTP in her left sacral area/SI area  Strength and sensation intact in left lower extremity   Neurological: She is alert and oriented to person, place, and time. She displays normal reflexes.  Vitals reviewed.      Assessment/plan: 1. Muscle strain Muscle strain with some sacro-ilitis. Steroid shot, muscle relaxers and continue with nsaids prn. Stretching exercises. If not better, may need to see sports med for further eval. She is at her baseline on right side. Let us know if not getting better.  - methylPREDNISolone acetate (DEPO-MEDROL) injection 80 mg     Return if symptoms worsen or fail to improve.    Orland Mustard, MD [place] [facility]   03/04/2018

## 2018-03-04 NOTE — Telephone Encounter (Signed)
Called patient appointment made for [facility] today.

## 2018-03-08 ENCOUNTER — Ambulatory Visit: Payer: Managed Care, Other (non HMO) | Admitting: Family Medicine

## 2018-03-08 ENCOUNTER — Encounter: Payer: Self-pay | Admitting: Family Medicine

## 2018-03-08 VITALS — BP 116/74 | HR 95 | Temp 98.0°F | Ht 66.5 in | Wt 158.8 lb

## 2018-03-08 DIAGNOSIS — M62830 Muscle spasm of back: Secondary | ICD-10-CM

## 2018-03-08 DIAGNOSIS — M5431 Sciatica, right side: Secondary | ICD-10-CM

## 2018-03-08 MED ORDER — KETOROLAC TROMETHAMINE 60 MG/2ML IM SOLN
60.0000 mg | Freq: Once | INTRAMUSCULAR | Status: AC
  Administered 2018-03-08: 60 mg via INTRAMUSCULAR

## 2018-03-08 NOTE — Progress Notes (Signed)
   Shelby Sutton is a 47 y.o. female here for an acute visit.  History of Present Illness:   Shelby Sutton CMA acting as scribe for Dr. Earlene Plater.  HPI: Patient comes in for lower back pain. Patient was seen by Dr. Artis Flock last week for the same concern. Patient has tried Flexeril and Aleve with no help. Patient stated that she has seen [facility] in the past. She said that she is ready to try surgery now.    PMHx, SurgHx, SocialHx, Medications, and Allergies were reviewed in the Visit Navigator and updated as appropriate.  Current Medications:   .  cyclobenzaprine (FLEXERIL) 10 MG tablet, Take 1 tablet (10 mg total) by mouth 3 (three) times daily as needed for muscle spasms., Disp: 30 tablet, Rfl: 0 .  ergocalciferol (VITAMIN D2) 50000 units capsule, Take 50,000 Units by mouth once a week., Disp: , Rfl:  .  ibuprofen (ADVIL,MOTRIN) 200 MG tablet, Take 200 mg by mouth every 6 (six) hours as needed for   Allergies  Allergen Reactions  . Codeine Other (See Comments)    agitated   Review of Systems:   Pertinent items are noted in the HPI. Otherwise, ROS is negative.  Vitals:   Vitals:   03/08/18 1458  BP: 116/74  Pulse: 95  Temp: 98 F (36.7 C)  TempSrc: Oral  SpO2: 97%  Weight: 158 lb 12.8 oz (72 kg)  Height: 5' 6.5" (1.689 m)     Body mass index is 25.25 kg/m.  Physical Exam:   Physical Exam  Constitutional: She appears well-nourished.  HENT:  Head: Normocephalic and atraumatic.  Eyes: Pupils are equal, round, and reactive to light. EOM are normal.  Neck: Normal range of motion. Neck supple.  Cardiovascular: Normal rate, regular rhythm, normal heart sounds and intact distal pulses.  Pulmonary/Chest: Effort normal.  Abdominal: Soft.  Musculoskeletal:       Lumbar back: She exhibits decreased range of motion, bony tenderness, deformity and spasm.  Skin: Skin is warm.  Psychiatric: She has a normal mood and affect. Her behavior is normal.  Nursing note and  vitals reviewed.  Assessment and Plan:   Shelby Sutton was seen today for back pain.  Diagnoses and all orders for this visit:  Right sided sciatica -     MR Lumbar Spine Wo Contrast; Future -     ketorolac (TORADOL) injection 60 mg  Spasm of lumbar paraspinous muscle -     ketorolac (TORADOL) injection 60 mg   . Reviewed expectations re: course of current medical issues. . Discussed self-management of symptoms. . Outlined signs and symptoms indicating need for more acute intervention. . Patient verbalized understanding and all questions were answered. Marland Kitchen Health Maintenance issues including appropriate healthy diet, exercise, and smoking avoidance were discussed with patient. . See orders for this visit as documented in the electronic medical record. . Patient received an After Visit Summary.  CMA served as Neurosurgeon during this visit. History, Physical, and Plan performed by medical provider. The above documentation has been reviewed and is accurate and complete. Helane Rima, D.O.  Helane Rima, DO [place] 03/08/2018

## 2018-03-09 ENCOUNTER — Encounter: Payer: Self-pay | Admitting: Family Medicine

## 2018-03-12 ENCOUNTER — Other Ambulatory Visit: Payer: Self-pay | Admitting: Surgical

## 2018-03-12 DIAGNOSIS — M5431 Sciatica, right side: Secondary | ICD-10-CM

## 2018-04-12 DIAGNOSIS — M5126 Other intervertebral disc displacement, lumbar region: Secondary | ICD-10-CM | POA: Insufficient documentation

## 2018-12-07 IMAGING — US ULTRASOUND LEFT BREAST LIMITED
1 series · 13 of 18 positions shown · non-contrast
Comparison: 05/22/2017 and earlier

CLINICAL DATA: The patient returns after screening study for
evaluation of a possible left breast asymmetry.

EXAM:
2D DIGITAL DIAGNOSTIC LEFT MAMMOGRAM WITH CAD AND ADJUNCT TOMO
ULTRASOUND LEFT BREAST

[Series 1: ultrasound left breast limited · 0.06mm/px · 13 of 18 slices shown]
[im 1/18]
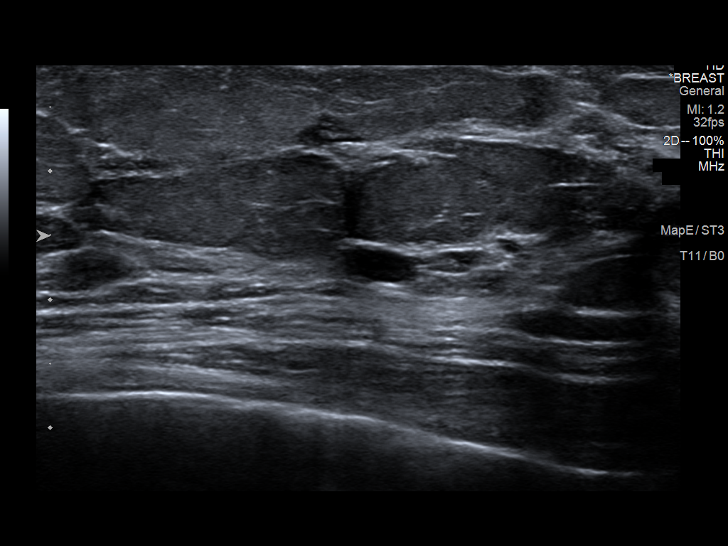
[im 3/18]
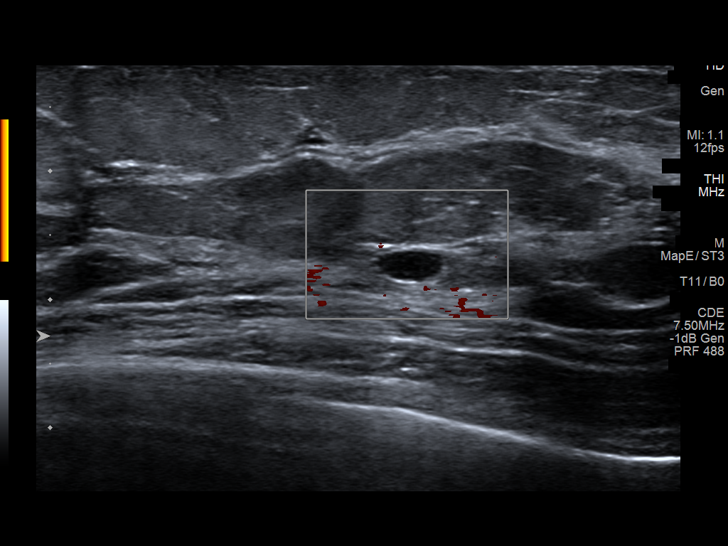
[im 4/18]
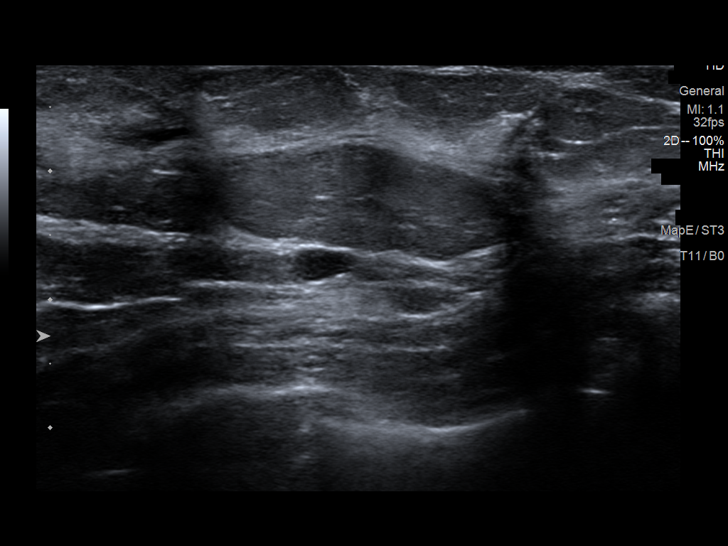
[im 5/18]
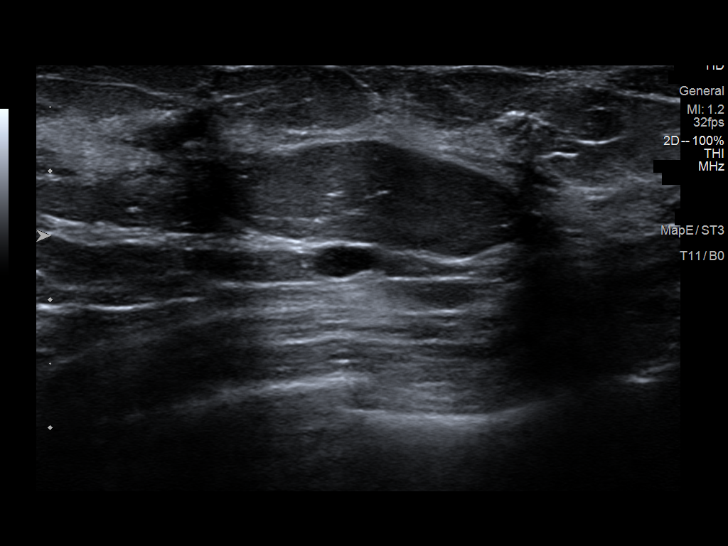
[im 7/18]
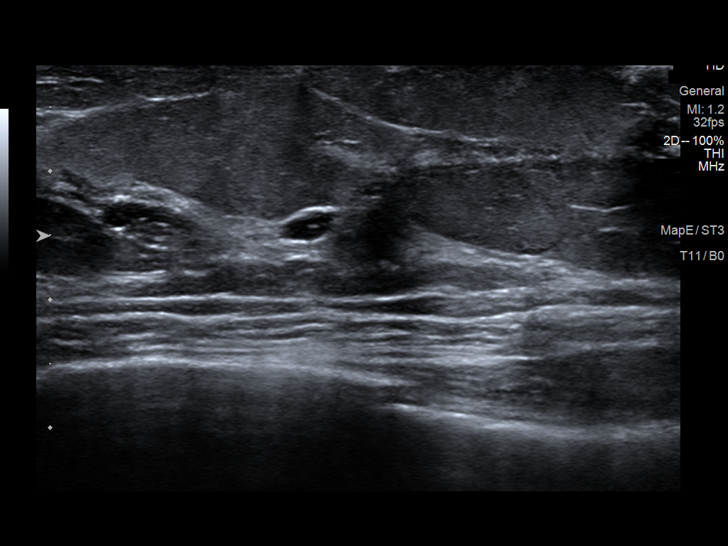
[im 8/18]
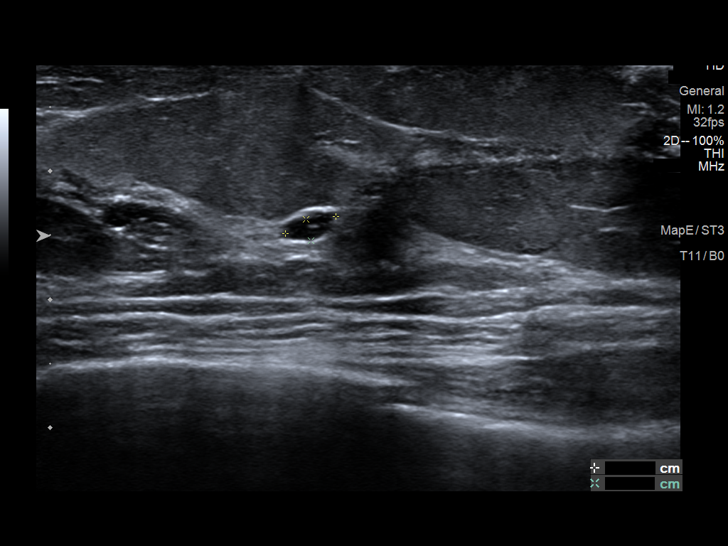
[im 10/18]
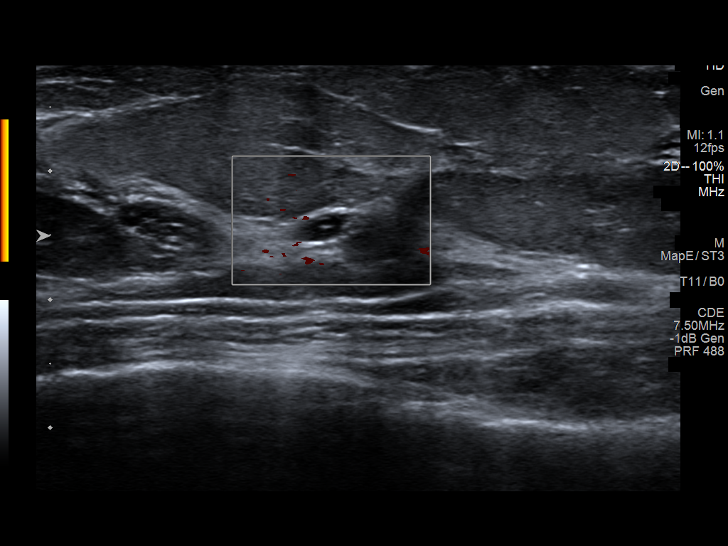
[im 11/18]
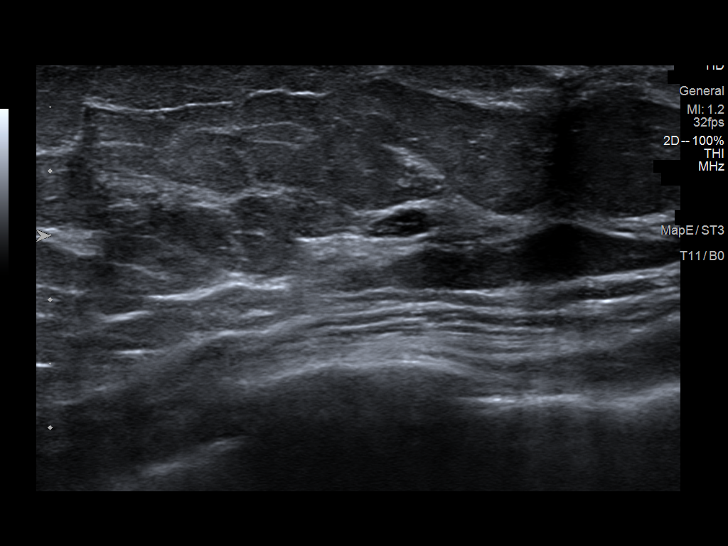
[im 12/18]
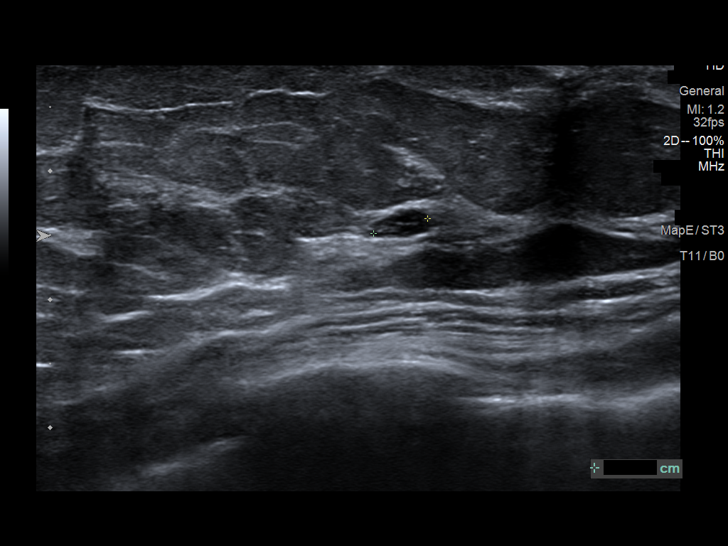
[im 14/18]
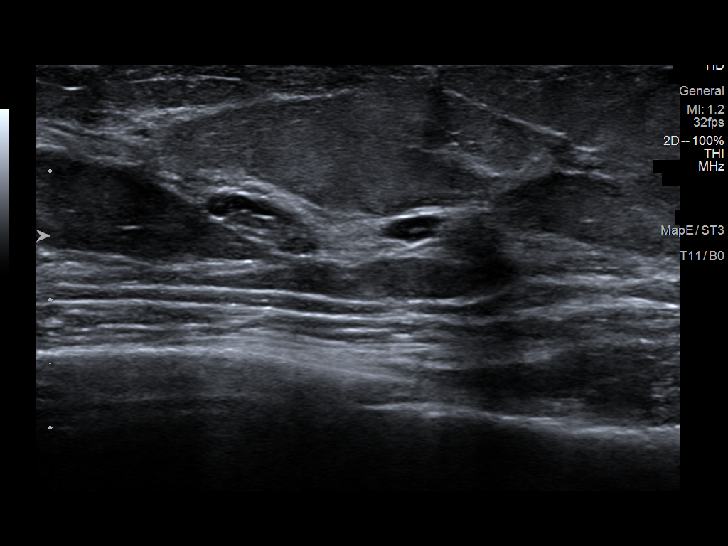
[im 15/18]
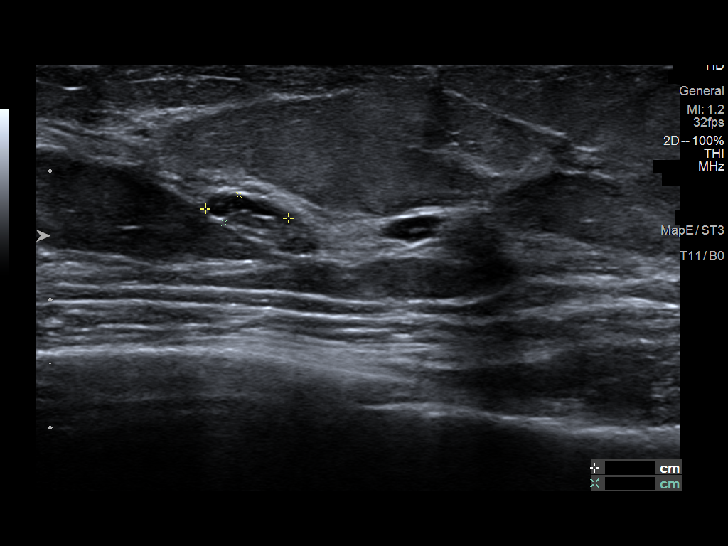
[im 16/18]
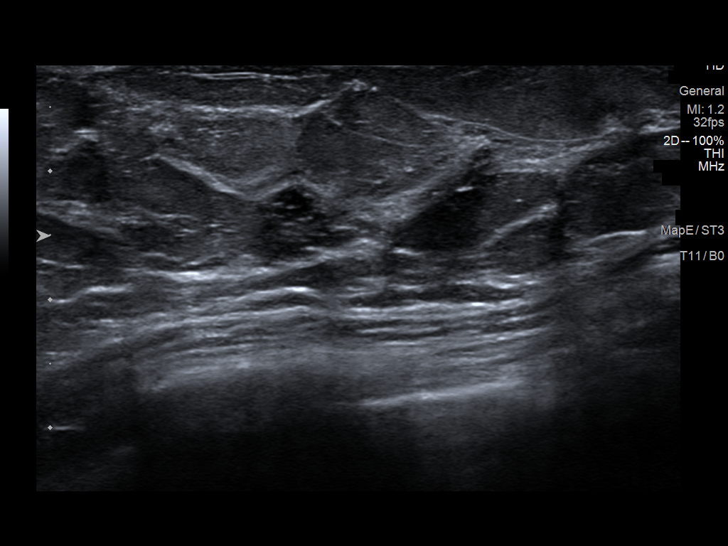
[im 18/18]
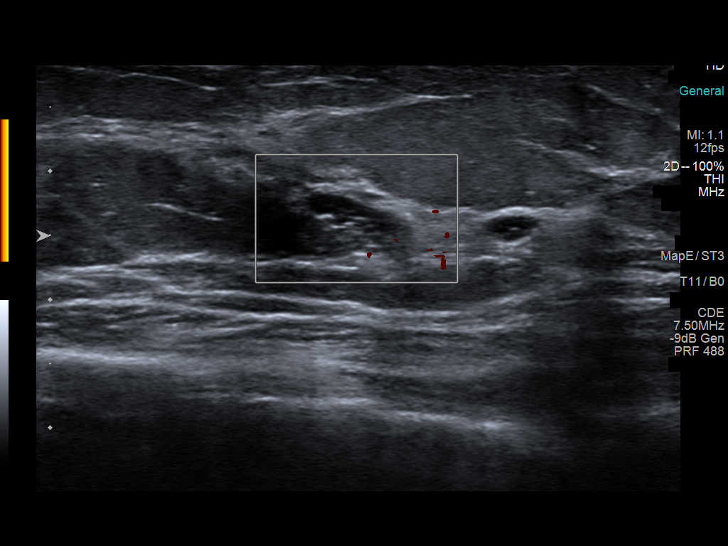

[13 of 18 positions shown; findings below may reference images not displayed]

ACR Breast Density Category c: The breast tissue is heterogeneously
dense, which may obscure small masses.
FINDINGS: Additional 2-D and 3-D images are performed. These views confirm
presence of a small round circumscribed mass in the lower central
portion of the left breast further evaluated with ultrasound.

Mammographic images were processed with CAD.

Targeted ultrasound is performed, showing an anechoic cyst in the 5
o'clock location of the left breast 1 cm from the nipple which
measures 0.5 x 0.2 x 0.5 cm. Smaller sister also identified in the 6
o'clock location 1 cm from the nipple. No solid mass or areas of
acoustic shadowing are identified.
IMPRESSION: Benign cysts in the lower portion of the left breast accounting for
the mammographic abnormality.

RECOMMENDATION:
Screening mammogram in one year.(Code:GA-R-BF9)

I have discussed the findings and recommendations with the patient.
Results were also provided in writing at the conclusion of the
visit. If applicable, a reminder letter will be sent to the patient
regarding the next appointment.

BI-RADS CATEGORY  2: Benign.

## 2018-12-07 IMAGING — MG 2D DIGITAL DIAGNOSTIC UNILATERAL LEFT MAMMOGRAM WITH CAD AND ADJ
6 series · 6 of 14 positions shown · non-contrast
Comparison: 05/22/2017 and earlier

CLINICAL DATA: The patient returns after screening study for
evaluation of a possible left breast asymmetry.

EXAM:
2D DIGITAL DIAGNOSTIC LEFT MAMMOGRAM WITH CAD AND ADJUNCT TOMO
ULTRASOUND LEFT BREAST

[L ML synth-2D]
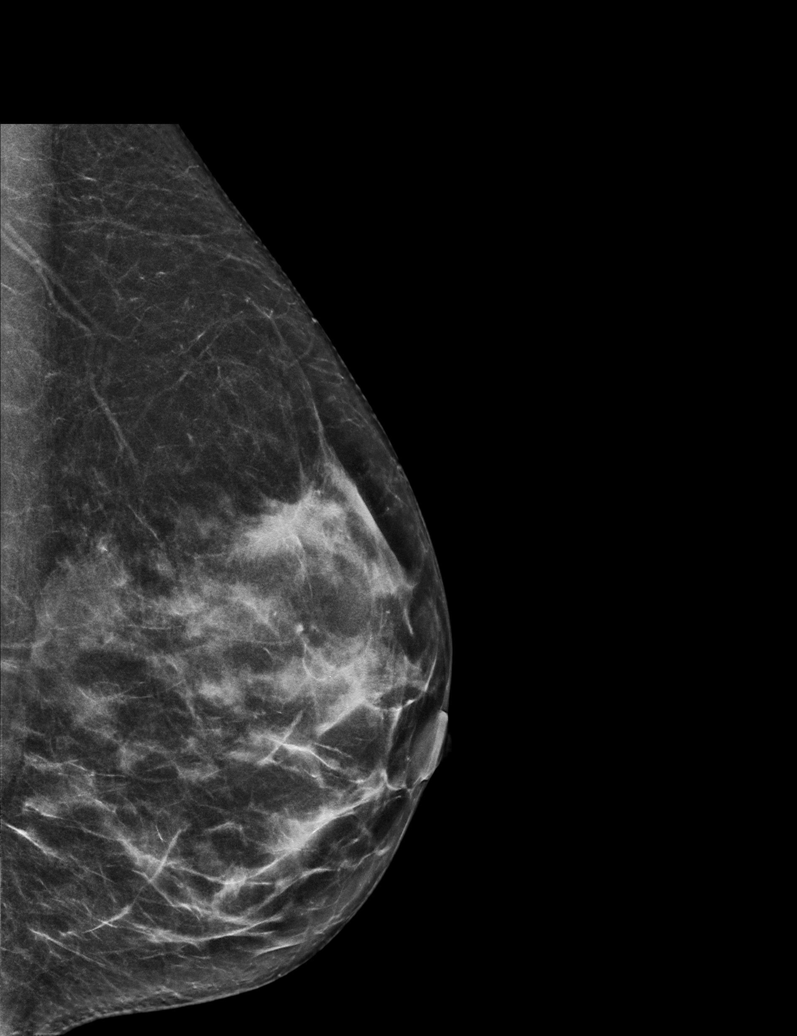

[L CC synth-2D]
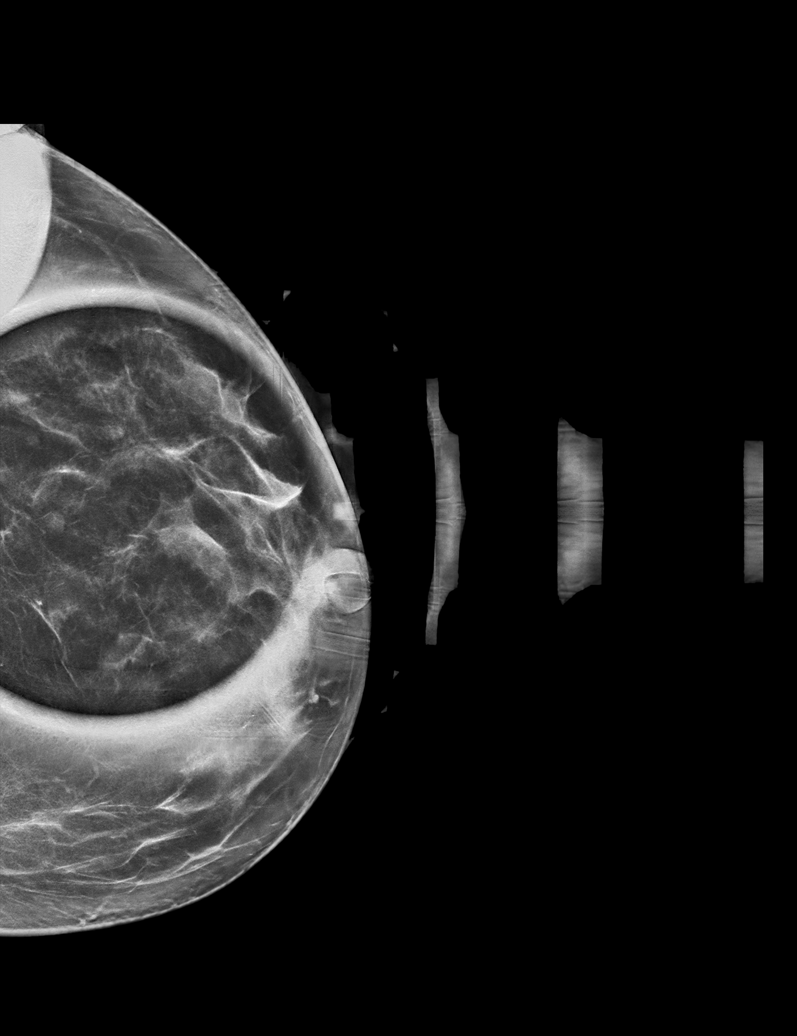

[L CC]
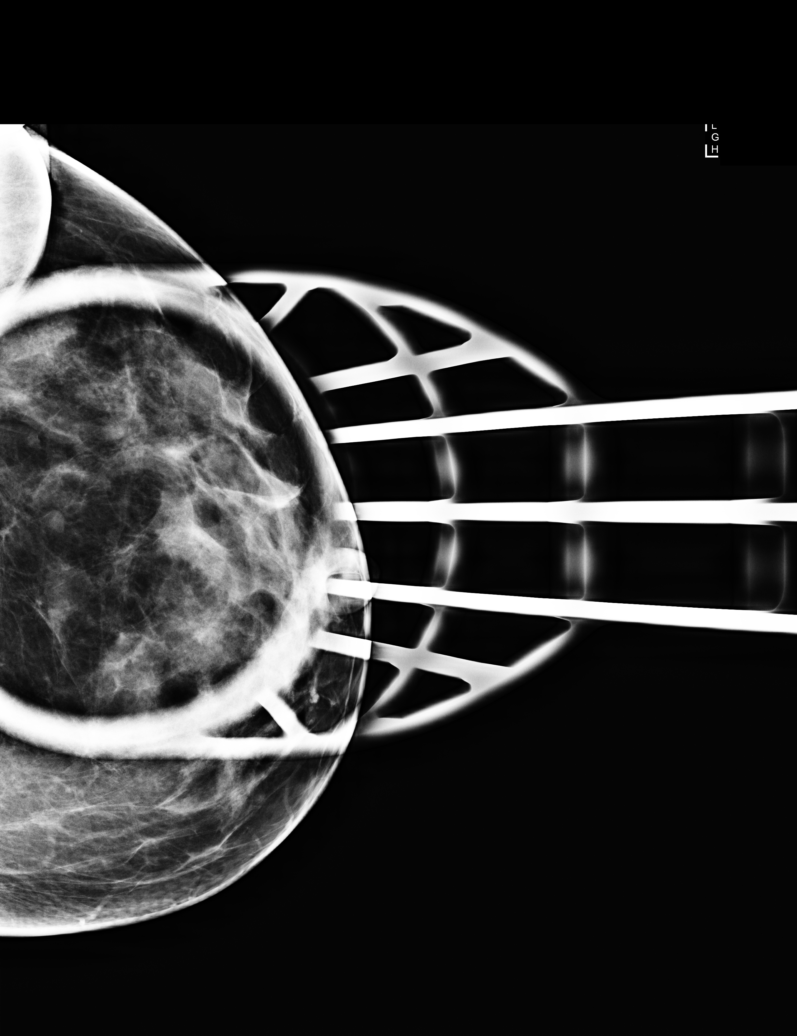

[L ML]
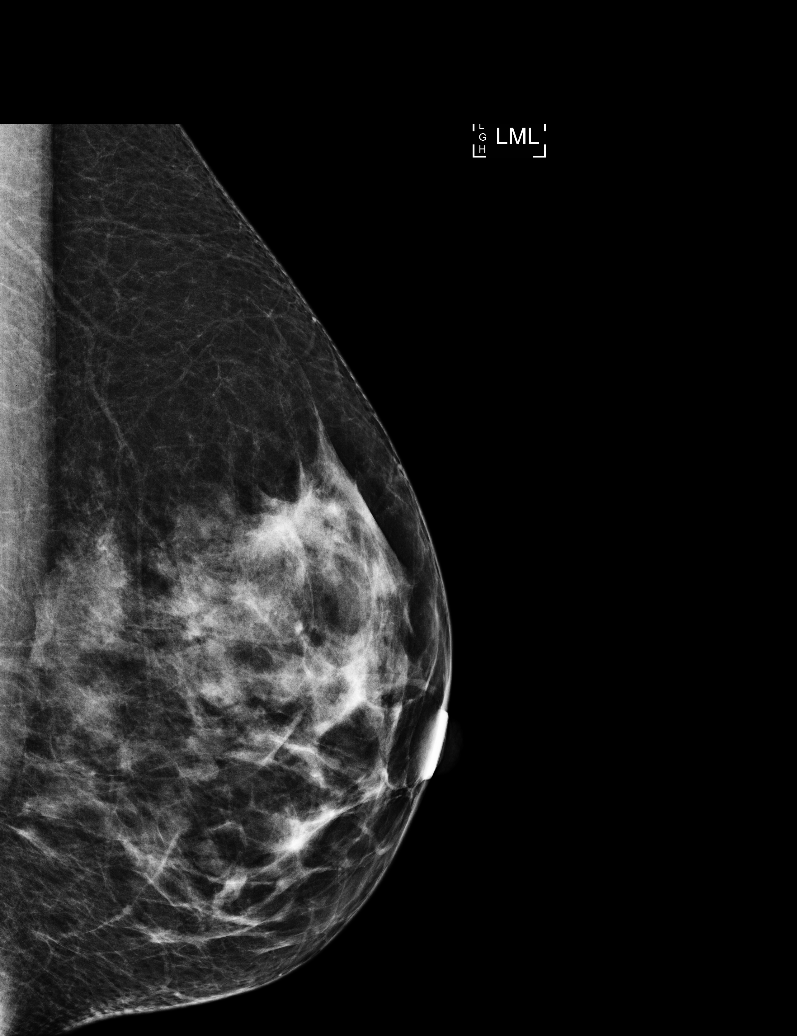

[L ML tomo · tomo slice 30/59.0]
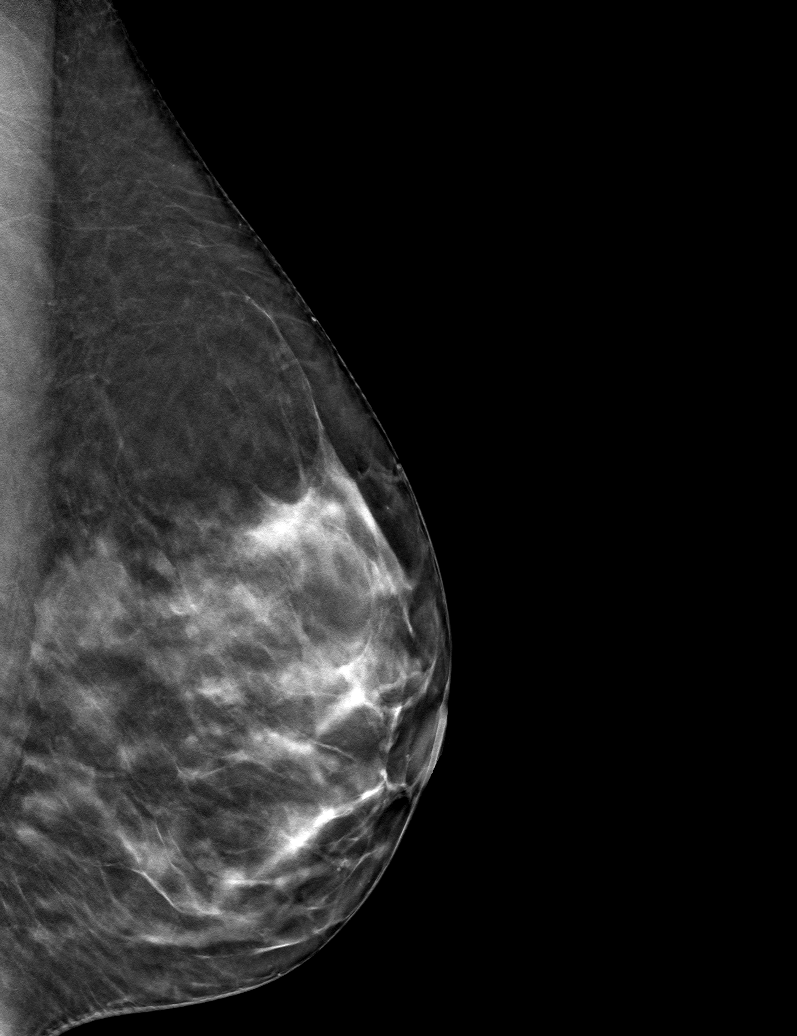

[L CC tomo · tomo slice 33/64.0]
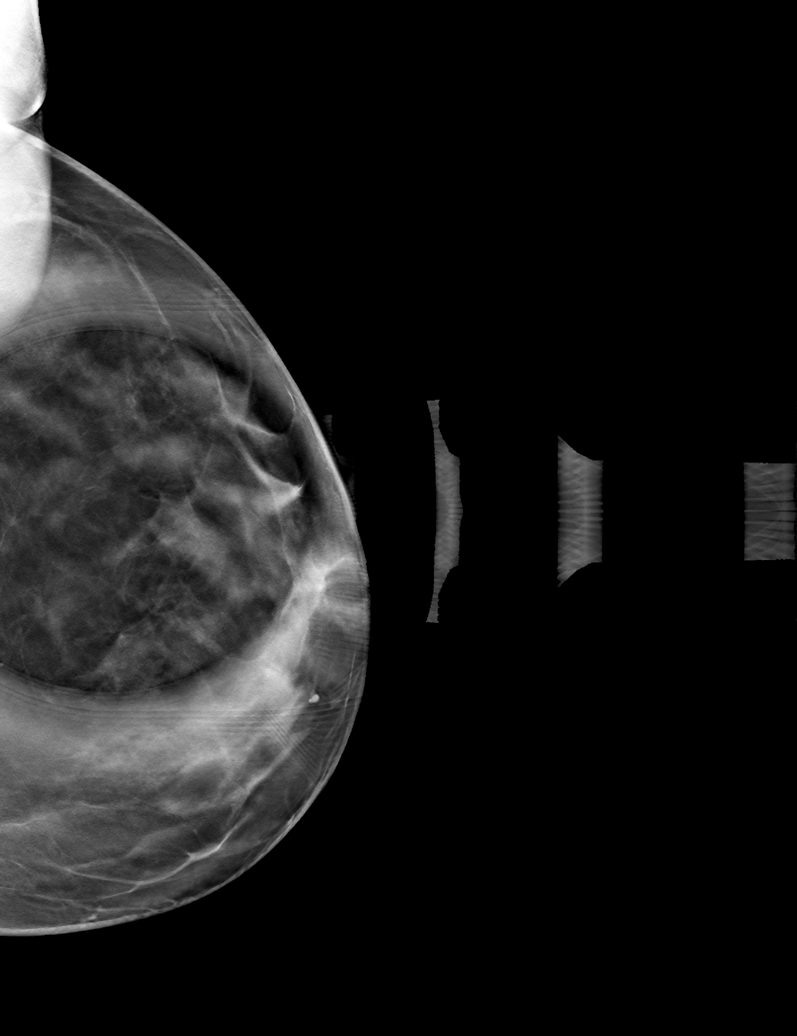

[6 of 14 positions shown; findings below may reference images not displayed]

ACR Breast Density Category c: The breast tissue is heterogeneously
dense, which may obscure small masses.
FINDINGS: Additional 2-D and 3-D images are performed. These views confirm
presence of a small round circumscribed mass in the lower central
portion of the left breast further evaluated with ultrasound.

Mammographic images were processed with CAD.

Targeted ultrasound is performed, showing an anechoic cyst in the 5
o'clock location of the left breast 1 cm from the nipple which
measures 0.5 x 0.2 x 0.5 cm. Smaller sister also identified in the 6
o'clock location 1 cm from the nipple. No solid mass or areas of
acoustic shadowing are identified.
IMPRESSION: Benign cysts in the lower portion of the left breast accounting for
the mammographic abnormality.

RECOMMENDATION:
Screening mammogram in one year.(Code:GA-R-BF9)

I have discussed the findings and recommendations with the patient.
Results were also provided in writing at the conclusion of the
visit. If applicable, a reminder letter will be sent to the patient
regarding the next appointment.

BI-RADS CATEGORY  2: Benign.

## 2019-03-04 ENCOUNTER — Other Ambulatory Visit: Payer: Self-pay

## 2019-03-04 DIAGNOSIS — Z20822 Contact with and (suspected) exposure to covid-19: Secondary | ICD-10-CM

## 2019-03-05 LAB — NOVEL CORONAVIRUS, NAA: SARS-CoV-2, NAA: NOT DETECTED
